# Patient Record
Sex: Female | Born: 1980
Health system: Southern US, Community
[De-identification: ages and names within clinical notes are randomized; demographics above are authoritative.]

## PROBLEM LIST (undated history)

## (undated) DIAGNOSIS — M5416 Radiculopathy, lumbar region: Secondary | ICD-10-CM

## (undated) DIAGNOSIS — R202 Paresthesia of skin: Secondary | ICD-10-CM

## (undated) DIAGNOSIS — E7800 Pure hypercholesterolemia, unspecified: Secondary | ICD-10-CM

## (undated) DIAGNOSIS — M1990 Unspecified osteoarthritis, unspecified site: Secondary | ICD-10-CM

## (undated) DIAGNOSIS — M549 Dorsalgia, unspecified: Secondary | ICD-10-CM

## (undated) DIAGNOSIS — I10 Essential (primary) hypertension: Secondary | ICD-10-CM

## (undated) DIAGNOSIS — G629 Polyneuropathy, unspecified: Secondary | ICD-10-CM

## (undated) DIAGNOSIS — G8929 Other chronic pain: Secondary | ICD-10-CM

## (undated) DIAGNOSIS — F410 Panic disorder [episodic paroxysmal anxiety] without agoraphobia: Secondary | ICD-10-CM

## (undated) DIAGNOSIS — F419 Anxiety disorder, unspecified: Secondary | ICD-10-CM

## (undated) HISTORY — DX: Pure hypercholesterolemia, unspecified: E78.00

## (undated) HISTORY — PX: NO PAST SURGERIES: SHX2092

---

## 1998-02-03 ENCOUNTER — Emergency Department (HOSPITAL_COMMUNITY): Admission: EM | Admit: 1998-02-03 | Discharge: 1998-02-03 | Payer: Self-pay | Admitting: Emergency Medicine

## 1998-03-26 ENCOUNTER — Emergency Department (HOSPITAL_COMMUNITY): Admission: EM | Admit: 1998-03-26 | Discharge: 1998-03-26 | Payer: Self-pay | Admitting: Emergency Medicine

## 1998-08-22 ENCOUNTER — Encounter: Payer: Self-pay | Admitting: Emergency Medicine

## 1998-08-22 ENCOUNTER — Emergency Department (HOSPITAL_COMMUNITY): Admission: EM | Admit: 1998-08-22 | Discharge: 1998-08-22 | Payer: Self-pay | Admitting: Emergency Medicine

## 1998-10-08 ENCOUNTER — Inpatient Hospital Stay (HOSPITAL_COMMUNITY): Admission: AD | Admit: 1998-10-08 | Discharge: 1998-10-11 | Payer: Self-pay | Admitting: *Deleted

## 1998-11-19 ENCOUNTER — Ambulatory Visit (HOSPITAL_COMMUNITY): Admission: RE | Admit: 1998-11-19 | Discharge: 1998-11-19 | Payer: Self-pay | Admitting: *Deleted

## 1998-11-28 ENCOUNTER — Inpatient Hospital Stay (HOSPITAL_COMMUNITY): Admission: AD | Admit: 1998-11-28 | Discharge: 1998-11-28 | Payer: Self-pay | Admitting: *Deleted

## 1999-01-17 ENCOUNTER — Inpatient Hospital Stay (HOSPITAL_COMMUNITY): Admission: AD | Admit: 1999-01-17 | Discharge: 1999-01-17 | Payer: Self-pay | Admitting: *Deleted

## 1999-01-29 ENCOUNTER — Ambulatory Visit (HOSPITAL_COMMUNITY): Admission: RE | Admit: 1999-01-29 | Discharge: 1999-01-29 | Payer: Self-pay | Admitting: Obstetrics & Gynecology

## 1999-02-04 ENCOUNTER — Inpatient Hospital Stay (HOSPITAL_COMMUNITY): Admission: AD | Admit: 1999-02-04 | Discharge: 1999-02-04 | Payer: Self-pay | Admitting: Obstetrics

## 1999-02-13 ENCOUNTER — Encounter (HOSPITAL_COMMUNITY): Admission: RE | Admit: 1999-02-13 | Discharge: 1999-03-16 | Payer: Self-pay | Admitting: Obstetrics & Gynecology

## 1999-02-22 ENCOUNTER — Inpatient Hospital Stay (HOSPITAL_COMMUNITY): Admission: AD | Admit: 1999-02-22 | Discharge: 1999-02-22 | Payer: Self-pay | Admitting: Obstetrics

## 1999-03-09 ENCOUNTER — Encounter: Payer: Self-pay | Admitting: Obstetrics & Gynecology

## 1999-03-13 ENCOUNTER — Inpatient Hospital Stay (HOSPITAL_COMMUNITY): Admission: AD | Admit: 1999-03-13 | Discharge: 1999-03-15 | Payer: Self-pay | Admitting: Obstetrics & Gynecology

## 1999-08-01 ENCOUNTER — Emergency Department (HOSPITAL_COMMUNITY): Admission: EM | Admit: 1999-08-01 | Discharge: 1999-08-01 | Payer: Self-pay | Admitting: Emergency Medicine

## 1999-11-09 ENCOUNTER — Emergency Department (HOSPITAL_COMMUNITY): Admission: EM | Admit: 1999-11-09 | Discharge: 1999-11-09 | Payer: Self-pay | Admitting: Emergency Medicine

## 2000-01-26 ENCOUNTER — Emergency Department (HOSPITAL_COMMUNITY): Admission: EM | Admit: 2000-01-26 | Discharge: 2000-01-26 | Payer: Self-pay | Admitting: *Deleted

## 2000-01-29 ENCOUNTER — Emergency Department (HOSPITAL_COMMUNITY): Admission: EM | Admit: 2000-01-29 | Discharge: 2000-01-29 | Payer: Self-pay | Admitting: Emergency Medicine

## 2000-04-09 ENCOUNTER — Emergency Department (HOSPITAL_COMMUNITY): Admission: EM | Admit: 2000-04-09 | Discharge: 2000-04-09 | Payer: Self-pay | Admitting: Emergency Medicine

## 2000-10-06 ENCOUNTER — Ambulatory Visit (HOSPITAL_COMMUNITY): Admission: AD | Admit: 2000-10-06 | Discharge: 2000-10-06 | Payer: Self-pay | Admitting: Obstetrics and Gynecology

## 2000-10-06 ENCOUNTER — Encounter (INDEPENDENT_AMBULATORY_CARE_PROVIDER_SITE_OTHER): Payer: Self-pay

## 2001-01-15 ENCOUNTER — Emergency Department (HOSPITAL_COMMUNITY): Admission: EM | Admit: 2001-01-15 | Discharge: 2001-01-15 | Payer: Self-pay | Admitting: Emergency Medicine

## 2001-04-30 ENCOUNTER — Emergency Department (HOSPITAL_COMMUNITY): Admission: EM | Admit: 2001-04-30 | Discharge: 2001-04-30 | Payer: Self-pay | Admitting: Emergency Medicine

## 2001-07-23 ENCOUNTER — Emergency Department (HOSPITAL_COMMUNITY): Admission: EM | Admit: 2001-07-23 | Discharge: 2001-07-23 | Payer: Self-pay | Admitting: Emergency Medicine

## 2001-10-01 ENCOUNTER — Emergency Department (HOSPITAL_COMMUNITY): Admission: EM | Admit: 2001-10-01 | Discharge: 2001-10-01 | Payer: Self-pay | Admitting: Emergency Medicine

## 2002-03-01 ENCOUNTER — Inpatient Hospital Stay (HOSPITAL_COMMUNITY): Admission: AD | Admit: 2002-03-01 | Discharge: 2002-03-01 | Payer: Self-pay | Admitting: *Deleted

## 2002-03-13 ENCOUNTER — Inpatient Hospital Stay (HOSPITAL_COMMUNITY): Admission: AD | Admit: 2002-03-13 | Discharge: 2002-03-13 | Payer: Self-pay | Admitting: *Deleted

## 2002-03-28 ENCOUNTER — Inpatient Hospital Stay (HOSPITAL_COMMUNITY): Admission: AD | Admit: 2002-03-28 | Discharge: 2002-03-28 | Payer: Self-pay | Admitting: *Deleted

## 2002-04-23 ENCOUNTER — Ambulatory Visit (HOSPITAL_COMMUNITY): Admission: RE | Admit: 2002-04-23 | Discharge: 2002-04-23 | Payer: Self-pay | Admitting: *Deleted

## 2002-05-23 ENCOUNTER — Ambulatory Visit (HOSPITAL_COMMUNITY): Admission: RE | Admit: 2002-05-23 | Discharge: 2002-05-23 | Payer: Self-pay | Admitting: *Deleted

## 2002-05-30 ENCOUNTER — Encounter: Payer: Self-pay | Admitting: Obstetrics and Gynecology

## 2002-05-30 ENCOUNTER — Observation Stay (HOSPITAL_COMMUNITY): Admission: AD | Admit: 2002-05-30 | Discharge: 2002-05-31 | Payer: Self-pay | Admitting: Obstetrics and Gynecology

## 2002-06-16 ENCOUNTER — Inpatient Hospital Stay (HOSPITAL_COMMUNITY): Admission: AD | Admit: 2002-06-16 | Discharge: 2002-06-16 | Payer: Self-pay | Admitting: Family Medicine

## 2002-06-18 ENCOUNTER — Inpatient Hospital Stay (HOSPITAL_COMMUNITY): Admission: AD | Admit: 2002-06-18 | Discharge: 2002-06-18 | Payer: Self-pay | Admitting: *Deleted

## 2002-06-22 ENCOUNTER — Inpatient Hospital Stay (HOSPITAL_COMMUNITY): Admission: AD | Admit: 2002-06-22 | Discharge: 2002-06-22 | Payer: Self-pay | Admitting: Obstetrics and Gynecology

## 2002-06-25 ENCOUNTER — Inpatient Hospital Stay (HOSPITAL_COMMUNITY): Admission: AD | Admit: 2002-06-25 | Discharge: 2002-06-25 | Payer: Self-pay | Admitting: Obstetrics and Gynecology

## 2002-07-06 ENCOUNTER — Inpatient Hospital Stay (HOSPITAL_COMMUNITY): Admission: AD | Admit: 2002-07-06 | Discharge: 2002-07-12 | Payer: Self-pay | Admitting: Family Medicine

## 2002-07-08 ENCOUNTER — Encounter (INDEPENDENT_AMBULATORY_CARE_PROVIDER_SITE_OTHER): Payer: Self-pay | Admitting: *Deleted

## 2002-10-11 ENCOUNTER — Encounter: Admission: RE | Admit: 2002-10-11 | Discharge: 2002-10-11 | Payer: Self-pay | Admitting: Obstetrics and Gynecology

## 2002-11-09 ENCOUNTER — Encounter: Admission: RE | Admit: 2002-11-09 | Discharge: 2002-11-09 | Payer: Self-pay | Admitting: Family Medicine

## 2003-07-01 ENCOUNTER — Emergency Department (HOSPITAL_COMMUNITY): Admission: EM | Admit: 2003-07-01 | Discharge: 2003-07-01 | Payer: Self-pay | Admitting: *Deleted

## 2003-10-31 ENCOUNTER — Emergency Department (HOSPITAL_COMMUNITY): Admission: EM | Admit: 2003-10-31 | Discharge: 2003-10-31 | Payer: Self-pay | Admitting: Emergency Medicine

## 2004-03-29 ENCOUNTER — Emergency Department (HOSPITAL_COMMUNITY): Admission: EM | Admit: 2004-03-29 | Discharge: 2004-03-29 | Payer: Self-pay

## 2004-05-11 ENCOUNTER — Emergency Department (HOSPITAL_COMMUNITY): Admission: EM | Admit: 2004-05-11 | Discharge: 2004-05-11 | Payer: Self-pay | Admitting: Emergency Medicine

## 2004-08-31 ENCOUNTER — Emergency Department (HOSPITAL_COMMUNITY): Admission: EM | Admit: 2004-08-31 | Discharge: 2004-08-31 | Payer: Self-pay | Admitting: Emergency Medicine

## 2004-10-26 ENCOUNTER — Emergency Department (HOSPITAL_COMMUNITY): Admission: EM | Admit: 2004-10-26 | Discharge: 2004-10-27 | Payer: Self-pay | Admitting: Emergency Medicine

## 2006-04-03 ENCOUNTER — Emergency Department (HOSPITAL_COMMUNITY): Admission: EM | Admit: 2006-04-03 | Discharge: 2006-04-04 | Payer: Self-pay | Admitting: Emergency Medicine

## 2006-07-06 ENCOUNTER — Emergency Department (HOSPITAL_COMMUNITY): Admission: EM | Admit: 2006-07-06 | Discharge: 2006-07-06 | Payer: Self-pay | Admitting: Emergency Medicine

## 2006-08-04 ENCOUNTER — Emergency Department (HOSPITAL_COMMUNITY): Admission: EM | Admit: 2006-08-04 | Discharge: 2006-08-05 | Payer: Self-pay | Admitting: Emergency Medicine

## 2006-08-18 ENCOUNTER — Ambulatory Visit (HOSPITAL_COMMUNITY): Admission: RE | Admit: 2006-08-18 | Discharge: 2006-08-18 | Payer: Self-pay | Admitting: Obstetrics and Gynecology

## 2008-01-02 ENCOUNTER — Emergency Department (HOSPITAL_COMMUNITY): Admission: EM | Admit: 2008-01-02 | Discharge: 2008-01-03 | Payer: Self-pay | Admitting: Emergency Medicine

## 2008-03-24 ENCOUNTER — Emergency Department (HOSPITAL_COMMUNITY): Admission: EM | Admit: 2008-03-24 | Discharge: 2008-03-25 | Payer: Self-pay | Admitting: Emergency Medicine

## 2008-08-12 ENCOUNTER — Emergency Department (HOSPITAL_COMMUNITY): Admission: EM | Admit: 2008-08-12 | Discharge: 2008-08-12 | Payer: Self-pay | Admitting: Emergency Medicine

## 2008-08-22 ENCOUNTER — Emergency Department (HOSPITAL_COMMUNITY): Admission: EM | Admit: 2008-08-22 | Discharge: 2008-08-22 | Payer: Self-pay | Admitting: Emergency Medicine

## 2009-07-22 ENCOUNTER — Emergency Department (HOSPITAL_COMMUNITY): Admission: EM | Admit: 2009-07-22 | Discharge: 2009-07-22 | Payer: Self-pay | Admitting: Emergency Medicine

## 2009-07-31 ENCOUNTER — Emergency Department (HOSPITAL_COMMUNITY): Admission: EM | Admit: 2009-07-31 | Discharge: 2009-07-31 | Payer: Self-pay | Admitting: Emergency Medicine

## 2009-08-01 ENCOUNTER — Emergency Department (HOSPITAL_COMMUNITY): Admission: EM | Admit: 2009-08-01 | Discharge: 2009-08-01 | Payer: Self-pay | Admitting: Emergency Medicine

## 2010-05-28 ENCOUNTER — Emergency Department (HOSPITAL_COMMUNITY): Admission: EM | Admit: 2010-05-28 | Discharge: 2009-12-11 | Payer: Self-pay | Admitting: Emergency Medicine

## 2010-07-18 ENCOUNTER — Emergency Department (HOSPITAL_COMMUNITY)
Admission: EM | Admit: 2010-07-18 | Discharge: 2010-07-18 | Payer: Self-pay | Source: Home / Self Care | Admitting: Emergency Medicine

## 2010-07-18 LAB — BASIC METABOLIC PANEL
CO2: 26 mEq/L (ref 19–32)
Chloride: 99 mEq/L (ref 96–112)
Creatinine, Ser: 0.79 mg/dL (ref 0.4–1.2)
GFR calc Af Amer: >60 mL/min (ref >60)
Potassium: 3 mEq/L — ABNORMAL LOW (ref 3.5–5.1)

## 2010-07-18 LAB — CBC
HCT: 40.1 % (ref 36.0–46.0)
Hemoglobin: 15 g/dL (ref 12.0–15.0)
MCH: 32.5 pg (ref 26.0–34.0)
MCV: 86.8 fL (ref 78.0–100.0)
RBC: 4.62 MIL/uL (ref 3.87–5.11)

## 2010-07-18 LAB — DIFFERENTIAL
Basophils Relative: 0 % (ref 0–1)
Eosinophils Absolute: 0.1 10*3/uL (ref 0.0–0.7)
Eosinophils Relative: 1 % (ref 0–5)
Lymphocytes Relative: 33 % (ref 12–46)
Neutro Abs: 5 10*3/uL (ref 1.7–7.7)

## 2010-09-06 LAB — POCT I-STAT, CHEM 8
BUN: 8 mg/dL (ref 6–23)
Creatinine, Ser: 0.6 mg/dL (ref 0.4–1.2)
Hemoglobin: 13.6 g/dL (ref 12.0–15.0)
Potassium: 3.2 mEq/L — ABNORMAL LOW (ref 3.5–5.1)
Sodium: 137 mEq/L (ref 135–145)
TCO2: 21 mmol/L (ref 0–100)

## 2010-09-06 LAB — DIFFERENTIAL
Basophils Absolute: 0.1 10*3/uL (ref 0.0–0.1)
Eosinophils Absolute: 0.1 10*3/uL (ref 0.0–0.7)
Eosinophils Relative: 1 % (ref 0–5)
Lymphocytes Relative: 18 % (ref 12–46)
Lymphs Abs: 2 10*3/uL (ref 0.7–4.0)
Neutrophils Relative %: 74 % (ref 43–77)

## 2010-09-06 LAB — CBC
Platelets: 363 10*3/uL (ref 150–400)
RBC: 4.12 MIL/uL (ref 3.87–5.11)
RDW: 13.4 % (ref 11.5–15.5)
WBC: 11.1 10*3/uL — ABNORMAL HIGH (ref 4.0–10.5)

## 2010-09-06 LAB — GC/CHLAMYDIA PROBE AMP, GENITAL: Chlamydia, DNA Probe: NEGATIVE

## 2010-09-06 LAB — WET PREP, GENITAL
Trich, Wet Prep: NONE SEEN
Yeast Wet Prep HPF POC: NONE SEEN

## 2010-09-06 LAB — URINALYSIS, ROUTINE W REFLEX MICROSCOPIC
Bilirubin Urine: NEGATIVE
Glucose, UA: NEGATIVE mg/dL
Ketones, ur: NEGATIVE mg/dL
Specific Gravity, Urine: 1.017 (ref 1.005–1.030)
pH: 6 (ref 5.0–8.0)

## 2010-09-06 LAB — URINE MICROSCOPIC-ADD ON

## 2010-09-09 LAB — WET PREP, GENITAL: Yeast Wet Prep HPF POC: NONE SEEN

## 2010-09-09 LAB — CBC
MCHC: 34.3 g/dL (ref 30.0–36.0)
Platelets: 322 10*3/uL (ref 150–400)
RDW: 13.1 % (ref 11.5–15.5)

## 2010-09-09 LAB — GC/CHLAMYDIA PROBE AMP, GENITAL: Chlamydia, DNA Probe: NEGATIVE

## 2010-09-09 LAB — URINALYSIS, ROUTINE W REFLEX MICROSCOPIC
Bilirubin Urine: NEGATIVE
Glucose, UA: NEGATIVE mg/dL
Specific Gravity, Urine: 1.026 (ref 1.005–1.030)

## 2010-09-09 LAB — COMPREHENSIVE METABOLIC PANEL
ALT: 21 U/L (ref 0–35)
Albumin: 3.8 g/dL (ref 3.5–5.2)
Alkaline Phosphatase: 68 U/L (ref 39–117)
Calcium: 8.8 mg/dL (ref 8.4–10.5)
GFR calc Af Amer: >60 mL/min (ref >60)
Glucose, Bld: 98 mg/dL (ref 70–99)
Potassium: 3.6 mEq/L (ref 3.5–5.1)
Sodium: 135 mEq/L (ref 135–145)
Total Protein: 7.1 g/dL (ref 6.0–8.3)

## 2010-09-09 LAB — URINE MICROSCOPIC-ADD ON

## 2010-09-09 LAB — DIFFERENTIAL
Eosinophils Absolute: 0 10*3/uL (ref 0.0–0.7)
Lymphs Abs: 3.1 10*3/uL (ref 0.7–4.0)
Monocytes Absolute: 0.6 10*3/uL (ref 0.1–1.0)
Monocytes Relative: 7 % (ref 3–12)
Neutrophils Relative %: 58 % (ref 43–77)

## 2010-09-09 LAB — POCT PREGNANCY, URINE: Preg Test, Ur: NEGATIVE

## 2010-10-01 LAB — POCT I-STAT, CHEM 8
BUN: 3 mg/dL — ABNORMAL LOW (ref 6–23)
Chloride: 110 mEq/L (ref 96–112)
Sodium: 133 mEq/L — ABNORMAL LOW (ref 135–145)

## 2010-10-01 LAB — DIFFERENTIAL
Basophils Absolute: 0 10*3/uL (ref 0.0–0.1)
Lymphocytes Relative: 22 % (ref 12–46)
Lymphs Abs: 1.7 10*3/uL (ref 0.7–4.0)
Neutro Abs: 5.4 10*3/uL (ref 1.7–7.7)

## 2010-10-01 LAB — POCT CARDIAC MARKERS
CKMB, poc: <1 ng/mL — ABNORMAL LOW (ref 1.0–8.0)
Myoglobin, poc: 45.5 ng/mL (ref 12–200)
Troponin i, poc: <0.05 ng/mL (ref 0.00–0.09)

## 2010-10-01 LAB — CBC
Hemoglobin: 13.3 g/dL (ref 12.0–15.0)
Platelets: 318 10*3/uL (ref 150–400)
RDW: 13.2 % (ref 11.5–15.5)
WBC: 7.9 10*3/uL (ref 4.0–10.5)

## 2010-11-06 NOTE — Discharge Summary (Signed)
NAME:  Jacqueline Juarez, Jacqueline Juarez                      ACCOUNT NO.:  [id number]   MEDICAL RECORD NO.:  [id number]                   PATIENT TYPE:  INP   LOCATION:  9111                                 FACILITY:  [facility]   PHYSICIAN:  Tanya S. Shawnie Pons, M.D.                DATE OF BIRTH:  1981-01-01   DATE OF ADMISSION:  07/06/2002  DATE OF DISCHARGE:  07/12/2002                                 DISCHARGE SUMMARY   DISCHARGE DIAGNOSES:  1. Status post low transverse cesarean section.  2. Mild preeclampsia.  3. Anemia.  4. Right breast mass.   DISCHARGE MEDICATIONS:  1. Percocet 5/325 mg one q.4-6h. p.r.n. pain.  2. Ferrous sulfate 325 mg daily.  3. Depo Provera 150 mg IM x1 prior to discharge.   FOLLOW UP:  The patient instructed to follow up at the GYN Clinic in four  weeks for her postpartum check and to schedule a bilateral tubal ligation.  She is also encouraged to follow up on the breast mass for possible  ultrasound.   HOSPITAL COURSE:  Problem 1 - PREGNANCY:  This 30 year old African-American  female who was a gravida 3, para 1-0-1-1 presented to the [facility] at 37 weeks  and 6 days gestational age after being sent over from clinic secondary to  high blood pressure.  Her prenatal course was complicated by history of a  seizure, last one in 2000, anxiety attacks, radiation exposure, and anemia.  On admission she is afebrile.  Her blood pressure is 140/100.  Her cervix  was dilated 1 cm.  She was -3 and posterior.  Her strip was reassuring and  she was intermittently contracting.  She was admitted and treated with  clindamycin for GBS positive and induction of labor.  The patient was given  Cervidil x1 and started on Pitocin low dose protocol.  The patient was also  given Cytotec x2 and started on magnesium sulfate after she was in active  labor.  On hospital day number two patient was taken to the OR for cesarean  section as her cervix was 4-5 cm but she had had persistent nonreassuring  fetal heart rate and suspected abruption.  The patient tolerated surgery  well and an abruption was seen during surgery.  Please see operative note  for further details.  The patient delivered a viable female with cord pH  7.28.  Apgars 7 at one minute and 9 at five minutes.  The patient was  continued on magnesium sulfate postpartum and in order to reach a  therapeutic level, magnesium was increased to 3.5 g/hour.  At that point  patient diuresed 3 L and her IV infiltrated and at that point the magnesium  sulfate was discontinued.  Her blood pressures were stable on day of  discharge ranging from the 120s highest 140/70s-90s and she had no  complaints of headache or shortness of breath.  Her laboratories were  trending down and  she continued to diurese.  On day of discharge patient was  bottle feeding without difficulty and was also started on iron therapy for  anemia.  For contraception she will receive a Depo shot prior to discharge  and will sign tubal ligation papers as well and be seen in GYN Clinic in  four weeks to schedule that procedure.  Problem 2 - During hospitalization patient complained of lump in right  breast.  She was examined by a second year resident as well as Phil D. Rose,  M.D. who felt this right upper outer quadrant mass should be observed for  four to six weeks and follows up as an outpatient.  The patient understands  and has no further questions.   ADMISSION LABORATORY DATA:  Urinalysis with 100 protein.  Uric acid 4.4.  Creatinine 0.7.  AST 14, ALT less than 19, LDH 183.  White count 9.1,  hemoglobin 11.4, hematocrit 33.1, platelets 258,000.    DISCHARGE LABORATORY DATA:  White count 13.8, hemoglobin 9.2, hematocrit  27.3, platelets 260,000.  Creatinine 0.7.  AST 16, ALT less than 19, LDH  238, uric acid 5.8 decreased from 6.1.     Billey Gosling, M.D.                       Shelbie Proctor. Shawnie Pons, M.D.    AS/MEDQ  D:  07/12/2002  T:  07/12/2002  Job:  [id number]

## 2010-11-06 NOTE — Op Note (Signed)
[facility]  Patient:    Jacqueline Juarez, Jacqueline Juarez                     MRN: [id number] Proc. Date: 10/06/00 Adm. Date:  [id number] Attending:  Osborn Coho                           Operative Report  PREOPERATIVE DIAGNOSIS:       Spontaneous abortion.  POSTOPERATIVE DIAGNOSIS:      Spontaneous abortion.  PROCEDURE:                    Dilation and curettage.  SURGEON:                      Mark E. Dareen Piano, M.D.  ANESTHESIA:                   MAC with paracervical block.  ESTIMATED BLOOD LOSS:         50 cc.  COMPLICATIONS:                None.  DRAINS:                       Red rubber catheter to the bladder.  SPECIMENS:                    Products of conception sent to pathology.  INDICATIONS:                  The patient is a 30 year old black female who presented to [facility] complaining of vaginal bleeding.  She was evaluated by Dr. Beverely Pace, who found products of conception in the office.  she was then transferred to the [facility], where she was set up for dilation and curettage.  DESCRIPTION OF PROCEDURE:     The patient was taken to the operating room, where she was placed in the dorsal lithotomy position.  She was prepped with Betadine and draped in the usual fashion for this procedure.  MAC anesthesia was administered.  A paracervical block was placed.  A single-tooth tenaculum was applied to the anterior cervical lip.  The cervix was dilated to a #29 [place].  A 9 mm suction cannula was placed into the uterine cavity and products of conception withdrawn.  Sharp curettage was then performed, followed by repeat suction.  The patient tolerated the procedure well.  She was discharged to home.  She was sent home with Keflex 500 mg q.i.d. for two days and Anaprox Double Strength p.r.n.  The patients blood type is B positive and, therefore, no RhoGAM was indicated. DD:  10/06/00 TD:  10/08/00 Job: [id number]

## 2010-11-06 NOTE — Op Note (Signed)
NAME:  Jacqueline, Juarez                      ACCOUNT NO.:  [id number]   MEDICAL RECORD NO.:  [id number]                   PATIENT TYPE:  INP   LOCATION:  9178                                 FACILITY:  [facility]   PHYSICIAN:  Tanya S. Shawnie Pons, M.D.                DATE OF BIRTH:  Apr 09, 1981   DATE OF PROCEDURE:  07/08/2002  DATE OF DISCHARGE:                                 OPERATIVE REPORT   PREOPERATIVE DIAGNOSES:  1. Intrauterine pregnancy at 38-1/7 weeks.  2. Mild preeclampsia.  3. Nonreassuring fetal status.   POSTOPERATIVE DIAGNOSES:  1. Intrauterine pregnancy at 38-1/7 weeks.  2. Mild preeclampsia.  3. Nonreassuring fetal status.  4. Placental abruption.   PROCEDURE:  Primary low transverse cesarean section.   SURGEON:  Shelbie Proctor. Shawnie Pons, M.D.   ASSISTANT:  __________   ANESTHESIA:  Epidural, Burnett Corrente, M.D.   FINDINGS:  Viable female infant, Apgars 7 and 9, cord pH 7.28.  Weight is 6  pounds 15 ounces.  She has normal tubes, normal ovaries.  There was a  significant amount of clot in the uterus.   ESTIMATED BLOOD LOSS:  Approximately 1000 cubic centimeters.   SPECIMENS:  Placenta to pathology.   COMPLICATIONS:  None.   REASON FOR PROCEDURE:  Briefly, the patient is a 30 year old gravida 3, para  1, who is being induced at 38-1/7 weeks for mild preeclampsia at term.  Apparently her water spontaneously broke apart at approximately 1 p.m. and  she was apparently 3-4 cm.  She began to develop repetitive lates and what  had previously been a reassuring tracing, when I was called to see the  patient it was approximately 2:05, and there were repetitive lates noted  with decreased variability.  Additionally, the patient had a copious amount  of frank port wine bleeding from the vagina, suspected placental abruption  and counseled patient regarding risks and benefits of proceeding with  operative delivery.  The patient agreed, and we proceeded to the operating   room.   DESCRIPTION OF PROCEDURE:  The patient was placed in the supine position  with a left lateral tilt, and her epidural anesthesia was bolused.  She was  then prepped and draped in the usual sterile fashion and an Allis stent was  used to test the skin prior to making incision.  The patient was still  feeling a bit of pain and then an extra epidural bolus plus fentanyl IV and  Marcaine was used to numb the skin at which time Allis test was negative.  A  Pfannenstiel incision was then made through the skin, and the suture was  carried down to the underlying fascia which was sharply divided in the  midline.  This incision was extended with laterally with Mayo scissors.  The  Kocher clamps were then used to grasp the superior edge of the fascia and  dissect from the rectus muscle bluntly laterally and  sharply in the midline.  Similarly the inferior edge of the fascia was dissected from the rectus  muscle.  The rectus muscle was then divided in the midline bluntly and the  peritoneal cavity entered bluntly.  The incision was extended with two  __________ on either side of the incision.  Bladder blade was placed inside  the abdomen and a knife was used to make a low transverse incision on the  uterus.  Upon entry into the uterus, clots began copiously coming out of the  incision.  The infant was found to be in a vertex presentation and was  delivered atraumatically through the abdominal incision.  There was an  umbilical cord wrapped around the infant but no nuchal cord noted.  The cord  was clamped x2, and the infant was bulb suctioned on the abdomen, and the  baby was handed to the waiting pediatricians.  As stated previously, Apgars  were 7 and 9 and weight was 6 pounds and 15 ounces.  Cord pH was obtained  which was 7.28.  Cord blood was also obtained.  The placenta was then  delivered through the incision, and the uterine cavity cleaned with a dry  lap pad.  Edges of the uterine  incision were then grasped with ring forceps  and the uterine incision closed with a locked running #1 Vicryl suture.  The  second imbricating layer was then placed over that with good hemostasis  being found.  The abdomen was then copiously irrigated and the adnexa  visualized which appeared to be normal.  All blood clot was removed, and the  abdominal incision was again rechecked and found to be hemostatic.  A #1  Vicryl suture was then used to close the fascia in a running fashion.  Clips  were used to close the skin after irrigation, and hemostasis was obtained  with the Bovie cautery.  The patient tolerated the procedure well and was  awakened and taken to the recovery room in stable condition.  The infant was  taken to the newborn nursery also in stable condition.                                               Shelbie Proctor. Shawnie Pons, M.D.    TSP/MEDQ  D:  07/08/2002  T:  07/08/2002  Job:  [id number]

## 2011-03-18 LAB — DIFFERENTIAL
Basophils Absolute: 0
Eosinophils Absolute: 0.1
Eosinophils Relative: 1
Lymphs Abs: 2.6

## 2011-03-18 LAB — CBC
RBC: 4.2
WBC: 9.9

## 2011-03-18 LAB — COMPREHENSIVE METABOLIC PANEL
ALT: 21
AST: 19
CO2: 25
Chloride: 102
GFR calc Af Amer: >60
GFR calc non Af Amer: >60
Sodium: 135
Total Bilirubin: 1

## 2011-03-18 LAB — POCT PREGNANCY, URINE: Preg Test, Ur: NEGATIVE

## 2011-03-18 LAB — LIPASE, BLOOD: Lipase: 25

## 2011-03-19 LAB — URINE MICROSCOPIC-ADD ON

## 2011-03-19 LAB — URINALYSIS, ROUTINE W REFLEX MICROSCOPIC
Bilirubin Urine: NEGATIVE
Glucose, UA: NEGATIVE
Specific Gravity, Urine: 1.019
pH: 6

## 2011-03-23 LAB — POCT I-STAT, CHEM 8
BUN: <3 — ABNORMAL LOW
Chloride: 105
Creatinine, Ser: 0.7
Sodium: 137
TCO2: 23

## 2011-03-23 LAB — POCT CARDIAC MARKERS
CKMB, poc: <1 — ABNORMAL LOW
CKMB, poc: <1 — ABNORMAL LOW
Myoglobin, poc: 42.8
Troponin i, poc: <0.05

## 2011-03-23 LAB — URINALYSIS, ROUTINE W REFLEX MICROSCOPIC
Ketones, ur: NEGATIVE
Protein, ur: NEGATIVE
Urobilinogen, UA: 0.2

## 2011-03-23 LAB — URINE MICROSCOPIC-ADD ON

## 2012-03-20 ENCOUNTER — Emergency Department (HOSPITAL_COMMUNITY)
Admission: EM | Admit: 2012-03-20 | Discharge: 2012-03-20 | Disposition: A | Discharge status: Home / Self Care | Payer: Medicaid Other | Attending: Emergency Medicine | Admitting: Emergency Medicine

## 2012-03-20 ENCOUNTER — Encounter (HOSPITAL_COMMUNITY): Payer: Self-pay | Admitting: *Deleted

## 2012-03-20 DIAGNOSIS — I10 Essential (primary) hypertension: Secondary | ICD-10-CM | POA: Insufficient documentation

## 2012-03-20 DIAGNOSIS — M129 Arthropathy, unspecified: Secondary | ICD-10-CM | POA: Insufficient documentation

## 2012-03-20 DIAGNOSIS — K602 Anal fissure, unspecified: Secondary | ICD-10-CM | POA: Insufficient documentation

## 2012-03-20 DIAGNOSIS — E876 Hypokalemia: Secondary | ICD-10-CM | POA: Insufficient documentation

## 2012-03-20 HISTORY — DX: Essential (primary) hypertension: I10

## 2012-03-20 HISTORY — DX: Unspecified osteoarthritis, unspecified site: M19.90

## 2012-03-20 HISTORY — DX: Anxiety disorder, unspecified: F41.9

## 2012-03-20 HISTORY — DX: Panic disorder (episodic paroxysmal anxiety): F41.0

## 2012-03-20 LAB — CBC WITH DIFFERENTIAL/PLATELET
Basophils Absolute: 0 10*3/uL (ref 0.0–0.1)
Basophils Relative: 0 % (ref 0–1)
Eosinophils Absolute: 0.1 10*3/uL (ref 0.0–0.7)
Eosinophils Relative: 1 % (ref 0–5)
MCH: 31.1 pg (ref 26.0–34.0)
MCHC: 35.6 g/dL (ref 30.0–36.0)
MCV: 87.4 fL (ref 78.0–100.0)
Neutrophils Relative %: 44 % (ref 43–77)
Platelets: 312 10*3/uL (ref 150–400)
RBC: 4.28 MIL/uL (ref 3.87–5.11)
RDW: 13.7 % (ref 11.5–15.5)

## 2012-03-20 LAB — POCT I-STAT, CHEM 8
BUN: 12 mg/dL (ref 6–23)
Calcium, Ion: 1.13 mmol/L (ref 1.12–1.23)
Hemoglobin: 13.3 g/dL (ref 12.0–15.0)
Sodium: 140 mEq/L (ref 135–145)
TCO2: 26 mmol/L (ref 0–100)

## 2012-03-20 LAB — URINALYSIS, ROUTINE W REFLEX MICROSCOPIC
Bilirubin Urine: NEGATIVE
Ketones, ur: NEGATIVE mg/dL
Nitrite: NEGATIVE
Protein, ur: NEGATIVE mg/dL
Specific Gravity, Urine: 1.022 (ref 1.005–1.030)
Urobilinogen, UA: 1 mg/dL (ref 0.0–1.0)

## 2012-03-20 LAB — URINE MICROSCOPIC-ADD ON

## 2012-03-20 LAB — POCT PREGNANCY, URINE: Preg Test, Ur: NEGATIVE

## 2012-03-20 MED ORDER — PSYLLIUM 28 % PO PACK: 1.0000 | PACK | Freq: Two times a day (BID) | ORAL | Status: DC

## 2012-03-20 MED ORDER — BISACODYL 5 MG PO TBEC: 5.0000 mg | DELAYED_RELEASE_TABLET | Freq: Every day | ORAL | Status: DC | PRN

## 2012-03-20 MED ORDER — NITROGLYCERIN 0.4 % RE OINT: 1.0000 "application " | TOPICAL_OINTMENT | Freq: Three times a day (TID) | RECTAL | Status: DC

## 2012-03-20 MED ORDER — POTASSIUM CHLORIDE CRYS ER 20 MEQ PO TBCR
40.0000 meq | EXTENDED_RELEASE_TABLET | Freq: Once | ORAL | Status: AC
  Administered 2012-03-20: 40 meq via ORAL
  Filled 2012-03-20: qty 2

## 2012-03-20 MED ORDER — POTASSIUM CHLORIDE CRYS ER 20 MEQ PO TBCR: 20.0000 meq | EXTENDED_RELEASE_TABLET | Freq: Every day | ORAL | Status: DC

## 2012-03-20 NOTE — ED Notes (Signed)
C/o bilateral lowe abd pain & rectal pain, onset 1 week ago, worse yesterday and today with BM, last BM ~ 1800 (normal), (denies: nvd, fever, bleeding, urinary or vaginal sx), last ate 1700. Unsure of hemorroids. Denies passing gas or belching. No meds PTA.

## 2012-03-20 NOTE — ED Provider Notes (Signed)
History     CSN: [id number]  Arrival date & time 03/20/12  2049   First MD Initiated Contact with Patient 03/20/12 2145      Chief Complaint  Patient presents with  . Abdominal Pain  . Rectal Pain    (Consider location/radiation/quality/duration/timing/severity/associated sxs/prior treatment) Patient is a 31 y.o. female presenting with hematochezia.  Rectal Bleeding  The current episode started today. The problem occurs rarely. The problem has been resolved. The pain is mild. The stool is described as hard. There was no prior unsuccessful therapy. Associated symptoms include rectal pain. Pertinent negatives include no anorexia, no fever, no abdominal pain, no diarrhea, no hematemesis, no nausea, no vomiting, no hematuria, no vaginal bleeding, no vaginal discharge, no chest pain, no headaches, no coughing, no difficulty breathing and no rash. She has been behaving normally. She has been eating and drinking normally. Her past medical history is significant for abdominal surgery (c section). Her past medical history does not include developmental delay, Hirschsprung's disease, inflammatory bowel disease, recent abdominal injury, recent antibiotic use, recent change in diet or a recent illness.    Past Medical History  Diagnosis Date  . Anxiety   . Panic   . Arthritis   . Hypertension     History reviewed. No pertinent past surgical history.  No family history on file.  History  Substance Use Topics  . Smoking status: Never Smoker   . Smokeless tobacco: Not on file  . Alcohol Use: No    OB History    Grav Para Term Preterm Abortions TAB SAB Ect Mult Living                  Review of Systems  Constitutional: Negative for fever, chills, activity change and appetite change.  HENT: Negative for ear pain, congestion, rhinorrhea and neck pain.   Eyes: Negative for pain.  Respiratory: Negative for cough and shortness of breath.   Cardiovascular: Negative for chest pain and  palpitations.  Gastrointestinal: Positive for hematochezia and rectal pain. Negative for nausea, vomiting, abdominal pain, diarrhea, anorexia and hematemesis.  Genitourinary: Negative for dysuria, hematuria, vaginal bleeding, vaginal discharge, difficulty urinating and pelvic pain.  Musculoskeletal: Negative for back pain.  Skin: Negative for rash and wound.  Neurological: Negative for weakness and headaches.  Psychiatric/Behavioral: Negative for behavioral problems, confusion and agitation.    Allergies  Penicillins  Home Medications   Current Outpatient Rx  Name Route Sig Dispense Refill  . HYDROCHLOROTHIAZIDE 25 MG PO TABS Oral Take 25 mg by mouth daily.    Marland Kitchen HYDROCODONE-ACETAMINOPHEN 5-325 MG PO TABS Oral Take 1 tablet by mouth every 6 (six) hours as needed. For pain    . OMEGA-3-ACID ETHYL ESTERS 1 G PO CAPS Oral Take 1 g by mouth daily.      BP 157/95  Pulse 92  Temp 99.2 F (37.3 C) (Oral)  Resp 18  SpO2 100%  LMP 02/18/2012  Physical Exam  Constitutional: She is oriented to person, place, and time. She appears well-developed and well-nourished. No distress.  HENT:  Head: Normocephalic and atraumatic.  Nose: Nose normal.  Mouth/Throat: Oropharynx is clear and moist.  Eyes: EOM are normal. Pupils are equal, round, and reactive to light.  Neck: Normal range of motion. Neck supple. No tracheal deviation present.  Cardiovascular: Normal rate, regular rhythm, normal heart sounds and intact distal pulses.   Pulmonary/Chest: Effort normal and breath sounds normal. She has no rales.  Abdominal: Soft. Bowel sounds are normal. She  exhibits no distension. There is no tenderness. There is no rigidity, no rebound, no guarding, no CVA tenderness, no tenderness at McBurney's point and negative Murphy's sign. No hernia.  Genitourinary: Guaiac negative stool.       No hemorrhoids. Reproducible pain on rectal digital exam.  No external visible fissure    Musculoskeletal: Normal  range of motion. She exhibits no tenderness.  Neurological: She is alert and oriented to person, place, and time.  Skin: Skin is warm and dry. No rash noted.  Psychiatric: She has a normal mood and affect. Her behavior is normal.    ED Course  Procedures (including critical care time)  Results for orders placed during the hospital encounter of 03/20/12  URINALYSIS, ROUTINE W REFLEX MICROSCOPIC      Component Value Range   Color, Urine YELLOW  YELLOW   APPearance CLOUDY (*) CLEAR   Specific Gravity, Urine 1.022  1.005 - 1.030   pH 7.0  5.0 - 8.0   Glucose, UA NEGATIVE  NEGATIVE mg/dL   Hgb urine dipstick SMALL (*) NEGATIVE   Bilirubin Urine NEGATIVE  NEGATIVE   Ketones, ur NEGATIVE  NEGATIVE mg/dL   Protein, ur NEGATIVE  NEGATIVE mg/dL   Urobilinogen, UA 1.0  0.0 - 1.0 mg/dL   Nitrite NEGATIVE  NEGATIVE   Leukocytes, UA MODERATE (*) NEGATIVE  CBC WITH DIFFERENTIAL      Component Value Range   WBC 9.3  4.0 - 10.5 K/uL   RBC 4.28  3.87 - 5.11 MIL/uL   Hemoglobin 13.3  12.0 - 15.0 g/dL   HCT 29.5  28.4 - 13.2 %   MCV 87.4  78.0 - 100.0 fL   MCH 31.1  26.0 - 34.0 pg   MCHC 35.6  30.0 - 36.0 g/dL   RDW 44.0  10.2 - 72.5 %   Platelets 312  150 - 400 K/uL   Neutrophils Relative 44  43 - 77 %   Neutro Abs 4.1  1.7 - 7.7 K/uL   Lymphocytes Relative 44  12 - 46 %   Lymphs Abs 4.1 (*) 0.7 - 4.0 K/uL   Monocytes Relative 11  3 - 12 %   Monocytes Absolute 1.0  0.1 - 1.0 K/uL   Eosinophils Relative 1  0 - 5 %   Eosinophils Absolute 0.1  0.0 - 0.7 K/uL   Basophils Relative 0  0 - 1 %   Basophils Absolute 0.0  0.0 - 0.1 K/uL  POCT PREGNANCY, URINE      Component Value Range   Preg Test, Ur NEGATIVE  NEGATIVE  POCT I-STAT, CHEM 8      Component Value Range   Sodium 140  135 - 145 mEq/L   Potassium 2.5 (*) 3.5 - 5.1 mEq/L   Chloride 101  96 - 112 mEq/L   BUN 12  6 - 23 mg/dL   Creatinine, Ser 3.66  0.50 - 1.10 mg/dL   Glucose, Bld 440 (*) 70 - 99 mg/dL   Calcium, Ion 3.47  4.25 -  1.23 mmol/L   TCO2 26  0 - 100 mmol/L   Hemoglobin 13.3  12.0 - 15.0 g/dL   HCT 95.6  38.7 - 56.4 %   Comment NOTIFIED PHYSICIAN    URINE MICROSCOPIC-ADD ON      Component Value Range   Squamous Epithelial / LPF MANY (*) RARE   WBC, UA 11-20  <3 WBC/hpf   RBC / HPF 3-6  <3 RBC/hpf   Bacteria, UA FEW (*)  RARE   Urine-Other MUCOUS PRESENT    OCCULT BLOOD, POC DEVICE      Component Value Range   Fecal Occult Bld NEGATIVE       1. Fissure in ano   2. Hypokalemia       MDM    31 yo F in no acute distress, afebrile, vital signs stable, non toxic appearing who presents with  New onset anal pain during defecation. Minimal streaks of blood seen on tissue after wiping.  No hemorrhoid on exam.  Has had multiple hard stools during past week. Suspect anal fissure given hx and exam. Thorough discussion with patient including return precautions and plan. Will follow up with PCP.      New Prescriptions   BISACODYL (BISACODYL) 5 MG EC TABLET    Take 1 tablet (5 mg total) by mouth daily as needed for constipation.   NITROGLYCERIN 0.4 % OINT    Place 1 application rectally 3 (three) times daily.   POTASSIUM CHLORIDE SA (K-DUR,KLOR-CON) 20 MEQ TABLET    Take 1 tablet (20 mEq total) by mouth daily.   PSYLLIUM (METAMUCIL SMOOTH TEXTURE) 28 % PACKET    Take 1 packet by mouth 2 (two) times daily.           Nadara Mustard, MD 03/21/12 [phone]

## 2012-03-21 NOTE — ED Provider Notes (Signed)
I saw and evaluated the patient, reviewed the resident's note and I agree with the findings and plan.   .Face to face Exam:  General:  Awake HEENT:  Atraumatic Resp:  Normal effort Abd:  Nondistended Neuro:No focal weakness Lymph: No adenopathy   Nelia Shi, MD 03/21/12 2354

## 2012-10-21 ENCOUNTER — Encounter (HOSPITAL_COMMUNITY): Payer: Self-pay | Admitting: Emergency Medicine

## 2012-10-21 ENCOUNTER — Emergency Department (HOSPITAL_COMMUNITY)
Admission: EM | Admit: 2012-10-21 | Discharge: 2012-10-22 | Disposition: A | Discharge status: Home / Self Care | Payer: Medicaid Other | Attending: Emergency Medicine | Admitting: Emergency Medicine

## 2012-10-21 DIAGNOSIS — A5901 Trichomonal vulvovaginitis: Secondary | ICD-10-CM

## 2012-10-21 DIAGNOSIS — E669 Obesity, unspecified: Secondary | ICD-10-CM | POA: Insufficient documentation

## 2012-10-21 DIAGNOSIS — I10 Essential (primary) hypertension: Secondary | ICD-10-CM | POA: Insufficient documentation

## 2012-10-21 DIAGNOSIS — M129 Arthropathy, unspecified: Secondary | ICD-10-CM | POA: Insufficient documentation

## 2012-10-21 DIAGNOSIS — A5900 Urogenital trichomoniasis, unspecified: Secondary | ICD-10-CM | POA: Insufficient documentation

## 2012-10-21 DIAGNOSIS — Z79899 Other long term (current) drug therapy: Secondary | ICD-10-CM | POA: Insufficient documentation

## 2012-10-21 DIAGNOSIS — R350 Frequency of micturition: Secondary | ICD-10-CM | POA: Insufficient documentation

## 2012-10-21 DIAGNOSIS — F410 Panic disorder [episodic paroxysmal anxiety] without agoraphobia: Secondary | ICD-10-CM | POA: Insufficient documentation

## 2012-10-21 DIAGNOSIS — F411 Generalized anxiety disorder: Secondary | ICD-10-CM | POA: Insufficient documentation

## 2012-10-21 LAB — CBC WITH DIFFERENTIAL/PLATELET
Basophils Absolute: 0 10*3/uL (ref 0.0–0.1)
Lymphocytes Relative: 31 % (ref 12–46)
Lymphs Abs: 2.8 10*3/uL (ref 0.7–4.0)
Neutro Abs: 5.4 10*3/uL (ref 1.7–7.7)
Platelets: 317 10*3/uL (ref 150–400)
RBC: 4.27 MIL/uL (ref 3.87–5.11)
RDW: 14 % (ref 11.5–15.5)
WBC: 9.2 10*3/uL (ref 4.0–10.5)

## 2012-10-21 LAB — BASIC METABOLIC PANEL
CO2: 25 mEq/L (ref 19–32)
Chloride: 104 mEq/L (ref 96–112)
Glucose, Bld: 99 mg/dL (ref 70–99)
Potassium: 3.2 mEq/L — ABNORMAL LOW (ref 3.5–5.1)
Sodium: 138 mEq/L (ref 135–145)

## 2012-10-21 LAB — URINE MICROSCOPIC-ADD ON

## 2012-10-21 LAB — URINALYSIS, ROUTINE W REFLEX MICROSCOPIC
Glucose, UA: NEGATIVE mg/dL
Protein, ur: NEGATIVE mg/dL
Specific Gravity, Urine: 1.012 (ref 1.005–1.030)
Urobilinogen, UA: 0.2 mg/dL (ref 0.0–1.0)

## 2012-10-21 NOTE — ED Notes (Signed)
Patient states that she is having pelvic and back pain

## 2012-10-22 LAB — WET PREP, GENITAL

## 2012-10-22 MED ORDER — ONDANSETRON 8 MG PO TBDP
8.0000 mg | ORAL_TABLET | Freq: Once | ORAL | Status: AC
  Administered 2012-10-22: 8 mg via ORAL
  Filled 2012-10-22: qty 1

## 2012-10-22 MED ORDER — METRONIDAZOLE 500 MG PO TABS
2000.0000 mg | ORAL_TABLET | Freq: Once | ORAL | Status: AC
  Administered 2012-10-22: 2000 mg via ORAL
  Filled 2012-10-22: qty 4

## 2012-10-22 NOTE — ED Provider Notes (Signed)
History     CSN: [id number]  Arrival date & time 10/21/12  2205   First MD Initiated Contact with Patient 10/22/12 0011      Chief Complaint  Patient presents with  . Pelvic Pain  . Back Pain    (Consider location/radiation/quality/duration/timing/severity/associated sxs/prior treatment) HPI History provided by pt.   Pt a poor historian.  Is complaining of lower abdominal pain that started while trying to urinate and have a bm this afternoon. Improved w/ mobic but worsened after trying to go to the bathroom again this evening.  Initially reports that it starts in her right buttock and radiates to her lower abdomen, but then reports that it originates into the groin, pointing at her suprapubic region.  Has had increased urinary frequency but no associated fever, N/V/D, hematochezia/melena, rectal pain or vaginal sx.  No trauma.  Pain is minimal currently.   Past Medical History  Diagnosis Date  . Anxiety   . Panic   . Arthritis   . Hypertension     History reviewed. No pertinent past surgical history.  History reviewed. No pertinent family history.  History  Substance Use Topics  . Smoking status: Never Smoker   . Smokeless tobacco: Not on file  . Alcohol Use: No    OB History   Grav Para Term Preterm Abortions TAB SAB Ect Mult Living                  Review of Systems  All other systems reviewed and are negative.    Allergies  Penicillins  Home Medications   Current Outpatient Rx  Name  Route  Sig  Dispense  Refill  . hydrochlorothiazide (HYDRODIURIL) 25 MG tablet   Oral   Take 25 mg by mouth every morning.         . meloxicam (MOBIC) 15 MG tablet   Oral   Take 15 mg by mouth every evening.         . Multiple Vitamin (MULTIVITAMIN WITH MINERALS) TABS   Oral   Take 1 tablet by mouth every morning.         . potassium chloride SA (K-DUR,KLOR-CON) 20 MEQ tablet   Oral   Take 20 mEq by mouth every morning.           BP 157/83  Pulse 77   Temp(Src) 98.6 F (37 C) (Oral)  Resp 18  Ht 5\' 4"  (1.626 m)  Wt 250 lb (113.399 kg)  BMI 42.89 kg/m2  SpO2 100%  LMP 10/03/2012  Physical Exam  Nursing note and vitals reviewed. Constitutional: She is oriented to person, place, and time. She appears well-developed and well-nourished. No distress.  HENT:  Head: Normocephalic and atraumatic.  Eyes:  Normal appearance  Neck: Normal range of motion.  Cardiovascular: Normal rate and regular rhythm.   Pulmonary/Chest: Effort normal and breath sounds normal. No respiratory distress.  Abdominal: Soft. Bowel sounds are normal. She exhibits no distension and no mass. There is no rebound and no guarding.  Obese.  Mild tenderness suprapubic and LLQ.    Genitourinary:  No CVA tenderness.  Nml external genitalia.  No vaginal discharge/bleeding.  Cervix closed and appears nml.  No adnexal or cervical motion tenderess.    Musculoskeletal: Normal range of motion.  Neurological: She is alert and oriented to person, place, and time.  Skin: Skin is warm and dry. No rash noted.  Psychiatric: She has a normal mood and affect. Her behavior is normal.  ED Course  Procedures (including critical care time)  Labs Reviewed  WET PREP, GENITAL - Abnormal; Notable for the following:    Trich, Wet Prep MODERATE (*)    Clue Cells Wet Prep HPF POC RARE (*)    WBC, Wet Prep HPF POC MODERATE (*)    All other components within normal limits  URINALYSIS, ROUTINE W REFLEX MICROSCOPIC - Abnormal; Notable for the following:    Hgb urine dipstick SMALL (*)    Leukocytes, UA SMALL (*)    All other components within normal limits  BASIC METABOLIC PANEL - Abnormal; Notable for the following:    Potassium 3.2 (*)    All other components within normal limits  URINE MICROSCOPIC-ADD ON - Abnormal; Notable for the following:    Squamous Epithelial / LPF FEW (*)    All other components within normal limits  GC/CHLAMYDIA PROBE AMP  URINE CULTURE  CBC WITH  DIFFERENTIAL   No results found.   1. Trichomonas vaginitis       MDM  32yo F presents w/ lower abd pain.  Characteristics of pain, including where it originates, are very unclear, but started after urinating and having a BM this afternoon and is minimal unless she is using the bathroom.  Associated w/ increased urinary frequency only.  On exam, afebrile, NAD, abd soft/non-distended, mild suprapubic and LLQ tenderness, nml genitalia.  No bacteruria but urine sent for culture d/t 7-10 WBCs.  Wet prep positive for trich, likely an incidental finding.  Pt treated w/ 2g flagyl.  No indication for imaging at this time. Referred back to her PCP and advised to return for worsening sx.         Otilio Miu, PA-C 10/22/12 [phone]

## 2012-10-22 NOTE — ED Provider Notes (Signed)
Medical screening examination/treatment/procedure(s) were performed by non-physician practitioner and as supervising physician I was immediately available for consultation/collaboration.  Olivia Mackie, MD 10/22/12 [phone]

## 2012-10-23 LAB — URINE CULTURE: Colony Count: 30000

## 2013-09-28 ENCOUNTER — Other Ambulatory Visit: Payer: Self-pay | Admitting: Family Medicine

## 2013-09-28 ENCOUNTER — Other Ambulatory Visit (HOSPITAL_COMMUNITY)
Admission: RE | Admit: 2013-09-28 | Discharge: 2013-09-28 | Disposition: A | Discharge status: Home / Self Care | Payer: Medicaid Other | Source: Ambulatory Visit | Attending: Family Medicine | Admitting: Family Medicine

## 2013-09-28 DIAGNOSIS — Z124 Encounter for screening for malignant neoplasm of cervix: Secondary | ICD-10-CM | POA: Insufficient documentation

## 2013-09-28 DIAGNOSIS — N760 Acute vaginitis: Secondary | ICD-10-CM | POA: Insufficient documentation

## 2013-09-28 DIAGNOSIS — Z113 Encounter for screening for infections with a predominantly sexual mode of transmission: Secondary | ICD-10-CM | POA: Insufficient documentation

## 2013-09-28 DIAGNOSIS — Z1151 Encounter for screening for human papillomavirus (HPV): Secondary | ICD-10-CM | POA: Insufficient documentation

## 2014-05-11 ENCOUNTER — Emergency Department (HOSPITAL_COMMUNITY): Payer: Medicaid Other

## 2014-05-11 ENCOUNTER — Emergency Department (HOSPITAL_COMMUNITY)
Admission: EM | Admit: 2014-05-11 | Discharge: 2014-05-12 | Disposition: A | Discharge status: Home / Self Care | Payer: Medicaid Other | Attending: Emergency Medicine | Admitting: Emergency Medicine

## 2014-05-11 ENCOUNTER — Encounter (HOSPITAL_COMMUNITY): Payer: Self-pay | Admitting: Nurse Practitioner

## 2014-05-11 DIAGNOSIS — M545 Low back pain, unspecified: Secondary | ICD-10-CM

## 2014-05-11 DIAGNOSIS — Z79899 Other long term (current) drug therapy: Secondary | ICD-10-CM | POA: Diagnosis not present

## 2014-05-11 DIAGNOSIS — R200 Anesthesia of skin: Secondary | ICD-10-CM

## 2014-05-11 DIAGNOSIS — Z8659 Personal history of other mental and behavioral disorders: Secondary | ICD-10-CM | POA: Diagnosis not present

## 2014-05-11 DIAGNOSIS — R258 Other abnormal involuntary movements: Secondary | ICD-10-CM | POA: Diagnosis not present

## 2014-05-11 DIAGNOSIS — R350 Frequency of micturition: Secondary | ICD-10-CM | POA: Insufficient documentation

## 2014-05-11 DIAGNOSIS — R202 Paresthesia of skin: Secondary | ICD-10-CM | POA: Insufficient documentation

## 2014-05-11 DIAGNOSIS — Z3202 Encounter for pregnancy test, result negative: Secondary | ICD-10-CM | POA: Diagnosis not present

## 2014-05-11 DIAGNOSIS — R292 Abnormal reflex: Secondary | ICD-10-CM

## 2014-05-11 DIAGNOSIS — R209 Unspecified disturbances of skin sensation: Secondary | ICD-10-CM

## 2014-05-11 DIAGNOSIS — M542 Cervicalgia: Secondary | ICD-10-CM | POA: Diagnosis not present

## 2014-05-11 DIAGNOSIS — Z880 Allergy status to penicillin: Secondary | ICD-10-CM | POA: Insufficient documentation

## 2014-05-11 DIAGNOSIS — I10 Essential (primary) hypertension: Secondary | ICD-10-CM | POA: Diagnosis not present

## 2014-05-11 DIAGNOSIS — Z8739 Personal history of other diseases of the musculoskeletal system and connective tissue: Secondary | ICD-10-CM | POA: Diagnosis not present

## 2014-05-11 LAB — COMPREHENSIVE METABOLIC PANEL
ALK PHOS: 61 U/L (ref 39–117)
ALT: 15 U/L (ref 0–35)
AST: 15 U/L (ref 0–37)
Albumin: 3.4 g/dL — ABNORMAL LOW (ref 3.5–5.2)
Anion gap: 14 (ref 5–15)
BUN: 8 mg/dL (ref 6–23)
CO2: 22 meq/L (ref 19–32)
Calcium: 8.9 mg/dL (ref 8.4–10.5)
Chloride: 103 mEq/L (ref 96–112)
Creatinine, Ser: 0.67 mg/dL (ref 0.50–1.10)
GFR calc non Af Amer: >90 mL/min (ref >90)
GLUCOSE: 96 mg/dL (ref 70–99)
POTASSIUM: 3.7 meq/L (ref 3.7–5.3)
SODIUM: 139 meq/L (ref 137–147)
TOTAL PROTEIN: 6.9 g/dL (ref 6.0–8.3)
Total Bilirubin: 0.9 mg/dL (ref 0.3–1.2)

## 2014-05-11 LAB — URINALYSIS, ROUTINE W REFLEX MICROSCOPIC
Bilirubin Urine: NEGATIVE
Glucose, UA: NEGATIVE mg/dL
Ketones, ur: NEGATIVE mg/dL
NITRITE: NEGATIVE
PH: 6.5 (ref 5.0–8.0)
Protein, ur: NEGATIVE mg/dL
SPECIFIC GRAVITY, URINE: 1.02 (ref 1.005–1.030)
Urobilinogen, UA: 0.2 mg/dL (ref 0.0–1.0)

## 2014-05-11 LAB — URINE MICROSCOPIC-ADD ON

## 2014-05-11 LAB — CBC
HCT: 36.2 % (ref 36.0–46.0)
HEMOGLOBIN: 13 g/dL (ref 12.0–15.0)
MCH: 31.8 pg (ref 26.0–34.0)
MCHC: 35.9 g/dL (ref 30.0–36.0)
MCV: 88.5 fL (ref 78.0–100.0)
PLATELETS: 312 10*3/uL (ref 150–400)
RBC: 4.09 MIL/uL (ref 3.87–5.11)
RDW: 13 % (ref 11.5–15.5)
WBC: 7.6 10*3/uL (ref 4.0–10.5)

## 2014-05-11 LAB — POC URINE PREG, ED: Preg Test, Ur: NEGATIVE

## 2014-05-11 MED ORDER — GADOBENATE DIMEGLUMINE 529 MG/ML IV SOLN
15.0000 mL | Freq: Once | INTRAVENOUS | Status: AC | PRN
  Administered 2014-05-11: 11 mL via INTRAVENOUS

## 2014-05-11 NOTE — ED Notes (Signed)
She c/o lower back pain, tingling in her hands and feet and urinary frequency x 1 month.

## 2014-05-11 NOTE — ED Provider Notes (Signed)
CSN: [id number]     Arrival date & time 05/11/14  1617 History   First MD Initiated Contact with Patient 05/11/14 1823     Chief Complaint  Patient presents with  . Back Pain  . Numbness  . Urinary Frequency     (Consider location/radiation/quality/duration/timing/severity/associated sxs/prior Treatment) The history is provided by the patient and medical records. No language interpreter was used.     Jacqueline Juarez is a 33 y.o. female  with a hx of anxiety, arthritis, HTN, panic attacks presents to the Emergency Department with multiple complaints.  Pt c/o numbness in the left hand and left foot, not extending past the ankle or wrist rhythm for the last month or so. Patient denies weakness in the left hand or arm and also denies difficulty walking, stumbling or falling. Patient describes her numbness as a tingling sensation that is present persistently without waxing or waning. Patient endorses low back pain for the same amount of time. Patient reports she has not been evaluated for this before. She reports she has a history of hypertension but denies history of diabetes.  No aggravating or alleviating factors.  Patient denies saddle anesthesia, loss of bowel or bladder control however she does endorse urinary frequency and urgency. When questioned patient admits to neck pain. She denies urinary hesitancy or retention. She denies headache, vision changes, chest pain, shortness of breath, abdominal pain, nausea, vomiting, diarrhea, syncope, and vaginal complaints.  Past Medical History  Diagnosis Date  . Anxiety   . Panic   . Arthritis   . Hypertension    History reviewed. No pertinent past surgical history. History reviewed. No pertinent family history. History  Substance Use Topics  . Smoking status: Never Smoker   . Smokeless tobacco: Not on file  . Alcohol Use: Yes     Comment: rare   OB History    No data available     Review of Systems  Constitutional: Negative for  fever, diaphoresis, appetite change, fatigue and unexpected weight change.  HENT: Negative for mouth sores.   Eyes: Negative for visual disturbance.  Respiratory: Negative for cough, chest tightness, shortness of breath and wheezing.   Cardiovascular: Negative for chest pain.  Gastrointestinal: Negative for nausea, vomiting, abdominal pain, diarrhea and constipation.  Endocrine: Negative for polydipsia, polyphagia and polyuria.  Genitourinary: Positive for urgency and frequency. Negative for dysuria, hematuria, decreased urine volume, vaginal bleeding, vaginal discharge and vaginal pain.  Musculoskeletal: Positive for back pain and neck pain. Negative for neck stiffness.  Skin: Negative for rash.  Allergic/Immunologic: Negative for immunocompromised state.  Neurological: Positive for numbness. Negative for syncope, light-headedness and headaches.  Hematological: Does not bruise/bleed easily.  Psychiatric/Behavioral: Negative for sleep disturbance. The patient is not nervous/anxious.       Allergies  Penicillins  Home Medications   Prior to Admission medications   Medication Sig Start Date End Date Taking? Authorizing Provider  Multiple Vitamin (MULTIVITAMIN WITH MINERALS) TABS Take 1 tablet by mouth every morning.   Yes Historical Provider, MD  potassium chloride SA (K-DUR,KLOR-CON) 20 MEQ tablet Take 20 mEq by mouth every morning.   Yes Historical Provider, MD  PRESCRIPTION MEDICATION Take 1 tablet by mouth daily.   Yes Historical Provider, MD  PRESCRIPTION MEDICATION Apply 1 application topically daily.   Yes Historical Provider, MD   BP 131/72 mmHg  Pulse 69  Temp(Src) 99.6 F (37.6 C) (Oral)  Resp 16  SpO2 100% Physical Exam  Constitutional: She is oriented to person, place,  and time. She appears well-developed and well-nourished. No distress.  HENT:  Head: Normocephalic and atraumatic.  Mouth/Throat: Oropharynx is clear and moist. No oropharyngeal exudate.  Lip smacking   Eyes: Conjunctivae and EOM are normal. Pupils are equal, round, and reactive to light. No scleral icterus.  No horizontal, vertical or rotational nystagmus  Neck: Normal range of motion. Neck supple.  Full active and passive ROM without pain No midline or paraspinal tenderness No nuchal rigidity or meningeal signs  Cardiovascular: Normal rate, regular rhythm, normal heart sounds and intact distal pulses.   No murmur heard. RRR  Pulmonary/Chest: Effort normal and breath sounds normal. No respiratory distress. She has no wheezes. She has no rales.  Clear and equal breath sounds  Abdominal: Soft. Bowel sounds are normal. She exhibits no distension. There is no tenderness. There is no rebound and no guarding.  abd soft and nontender  Musculoskeletal: Normal range of motion.  Full range of motion of the T-spine and L-spine No tenderness to palpation of the spinous processes of the T-spine or L-spine Mild tenderness to palpation of the paraspinous muscles of the L-spine  Lymphadenopathy:    She has no cervical adenopathy.  Neurological: She is alert and oriented to person, place, and time. She has normal reflexes. No cranial nerve deficit. She exhibits normal muscle tone. Coordination normal.  Reflex Scores:      Bicep reflexes are 2+ on the right side and 2+ on the left side.      Brachioradialis reflexes are 2+ on the right side and 2+ on the left side.      Patellar reflexes are 2+ on the right side and 2+ on the left side.      Achilles reflexes are 2+ on the right side and 2+ on the left side. Mental Status:  Alert, oriented, thought content appropriate. Speech fluent without evidence of aphasia. Able to follow 2 step commands without difficulty.  Cranial Nerves:  II:  Peripheral visual fields grossly normal, pupils equal, round, reactive to light III,IV, VI: ptosis not present, extra-ocular motions intact bilaterally  V,VII: smile symmetric, facial light touch sensation equal VIII:  hearing grossly normal bilaterally  IX,X: gag reflex present  XI: bilateral shoulder shrug equal and strong XII: midline tongue extension  Motor:  5/5 in upper and lower extremities bilaterally including strong and equal grip strength and dorsiflexion/plantar flexion Sensory: Pinprick and light touch normal in all extremities.  Deep Tendon Reflexes: 2+ and symmetric in the upper extremities; 4+ hyperreflexia in the BLE  Cerebellar: normal finger-to-nose with bilateral upper extremities Gait: normal gait and balance CV: distal pulses palpable throughout  Significant clonus bilaterally   Skin: Skin is warm and dry. No rash noted. She is not diaphoretic. No erythema.  Psychiatric: She has a normal mood and affect. Her behavior is normal. Judgment and thought content normal.  Nursing note and vitals reviewed.   ED Course  Procedures (including critical care time) Labs Review Labs Reviewed  URINALYSIS, ROUTINE W REFLEX MICROSCOPIC - Abnormal; Notable for the following:    APPearance HAZY (*)    Hgb urine dipstick MODERATE (*)    Leukocytes, UA TRACE (*)    All other components within normal limits  URINE MICROSCOPIC-ADD ON - Abnormal; Notable for the following:    Squamous Epithelial / LPF FEW (*)    Bacteria, UA FEW (*)    All other components within normal limits  COMPREHENSIVE METABOLIC PANEL - Abnormal; Notable for the following:  Albumin 3.4 (*)    All other components within normal limits  URINE CULTURE  CBC  POC URINE PREG, ED    Imaging Review Mr Laqueta Jean Wo Contrast  05/12/2014   CLINICAL DATA:  Initial evaluation for hyper reflexia, numbness and tingling in bilateral hands. Evaluate for possible demyelinating disease.  EXAM: MRI HEAD WITHOUT AND WITH CONTRAST  MRI CERVICAL SPINE WITHOUT AND WITH CONTRAST  TECHNIQUE: Multiplanar, multiecho pulse sequences of the brain and surrounding structures, and cervical spine, to include the craniocervical junction and  cervicothoracic junction, were obtained without and with intravenous contrast.  CONTRAST:  11mL MULTIHANCE GADOBENATE DIMEGLUMINE 529 MG/ML IV SOLN  COMPARISON:  None.  FINDINGS: MRI HEAD FINDINGS  The CSF containing spaces are within normal limits for patient age. No focal parenchymal signal abnormality is identified. No mass lesion, midline shift, or extra-axial fluid collection. Ventricles are normal in size without evidence of hydrocephalus.  No abnormal enhancement on post-contrast sequences.  No diffusion-weighted signal abnormality is identified to suggest acute intracranial infarct. Gray-white matter differentiation is maintained. Normal flow voids are seen within the intracranial vasculature. No intracranial hemorrhage identified.  The cervicomedullary junction is normal. Pituitary gland is within normal limits. Pituitary stalk is midline. The globes and optic nerves demonstrate a normal appearance with normal signal intensity.  The bone marrow signal intensity is normal. Calvarium is intact. Degenerative changes noted about the C1-2 articulation. Visualized upper cervical spine otherwise within normal limits.  Scalp soft tissues are unremarkable.  Paranasal sinuses are clear.  No mastoid effusion.  MRI CERVICAL SPINE FINDINGS  There is straightening of the normal cervical lordosis, which may be related to patient positioning. No listhesis. Vertebral body heights are preserved.  Signal intensity within the vertebral body bone marrow is normal. No focal osseous lesion.  Signal intensity within the cervical spinal cord is within normal limits. No evidence for cord compression. No abnormal T2 hyperintense foci seen to suggest demyelination. No abnormal enhancement on post-contrast sequences.  Paraspinous soft tissues within normal limits. Normal intravascular flow voids seen within the vertebral arteries bilaterally.  C2-3:  Negative  C3-4:  Negative.  C4-5: Mild bilateral uncovertebral spurring without  significant stenosis.  C5-6: Mild bilateral uncovertebral spurring without significant stenosis.  C6-7:  Negative.  C7-T1:  Negative.  IMPRESSION: MRI HEAD IMPRESSION:  Normal brain MRI with no acute intracranial abnormality identified. No MRI evidence for demyelinating disease.  MRI CERVICAL SPINE IMPRESSION:  1. No MRI evidence for demyelinating disease identified within the cervical spine. No cord compression. 2. Mild bilateral uncovertebral spurring at C4-5 and C5-6 without significant stenosis.   Electronically Signed   By: Rise Mu M.D.   On: 05/12/2014 00:25   Mr Cervical Spine W Wo Contrast  05/12/2014   CLINICAL DATA:  Initial evaluation for hyper reflexia, numbness and tingling in bilateral hands. Evaluate for possible demyelinating disease.  EXAM: MRI HEAD WITHOUT AND WITH CONTRAST  MRI CERVICAL SPINE WITHOUT AND WITH CONTRAST  TECHNIQUE: Multiplanar, multiecho pulse sequences of the brain and surrounding structures, and cervical spine, to include the craniocervical junction and cervicothoracic junction, were obtained without and with intravenous contrast.  CONTRAST:  11mL MULTIHANCE GADOBENATE DIMEGLUMINE 529 MG/ML IV SOLN  COMPARISON:  None.  FINDINGS: MRI HEAD FINDINGS  The CSF containing spaces are within normal limits for patient age. No focal parenchymal signal abnormality is identified. No mass lesion, midline shift, or extra-axial fluid collection. Ventricles are normal in size without evidence of hydrocephalus.  No abnormal  enhancement on post-contrast sequences.  No diffusion-weighted signal abnormality is identified to suggest acute intracranial infarct. Gray-white matter differentiation is maintained. Normal flow voids are seen within the intracranial vasculature. No intracranial hemorrhage identified.  The cervicomedullary junction is normal. Pituitary gland is within normal limits. Pituitary stalk is midline. The globes and optic nerves demonstrate a normal appearance with  normal signal intensity.  The bone marrow signal intensity is normal. Calvarium is intact. Degenerative changes noted about the C1-2 articulation. Visualized upper cervical spine otherwise within normal limits.  Scalp soft tissues are unremarkable.  Paranasal sinuses are clear.  No mastoid effusion.  MRI CERVICAL SPINE FINDINGS  There is straightening of the normal cervical lordosis, which may be related to patient positioning. No listhesis. Vertebral body heights are preserved.  Signal intensity within the vertebral body bone marrow is normal. No focal osseous lesion.  Signal intensity within the cervical spinal cord is within normal limits. No evidence for cord compression. No abnormal T2 hyperintense foci seen to suggest demyelination. No abnormal enhancement on post-contrast sequences.  Paraspinous soft tissues within normal limits. Normal intravascular flow voids seen within the vertebral arteries bilaterally.  C2-3:  Negative  C3-4:  Negative.  C4-5: Mild bilateral uncovertebral spurring without significant stenosis.  C5-6: Mild bilateral uncovertebral spurring without significant stenosis.  C6-7:  Negative.  C7-T1:  Negative.  IMPRESSION: MRI HEAD IMPRESSION:  Normal brain MRI with no acute intracranial abnormality identified. No MRI evidence for demyelinating disease.  MRI CERVICAL SPINE IMPRESSION:  1. No MRI evidence for demyelinating disease identified within the cervical spine. No cord compression. 2. Mild bilateral uncovertebral spurring at C4-5 and C5-6 without significant stenosis.   Electronically Signed   By: Rise Mu M.D.   On: 05/12/2014 00:25     EKG Interpretation None      MDM   Final diagnoses:  Clonus  Neck pain  Hyperreflexia  Numbness and tingling in left hand  Numbness and tingling in right hand  Bilateral low back pain without sciatica   Jacqueline Juarez presents with multiple neurologic complaints with an abnormal neurologic exam.  Pt complains of numbness  to the left hand and foot. On exam she is noted to have tardive dyskinesia, hyperreflexia of the bilateral lower extremities and clonus bilaterally.  Sensation is grossly intact throughout.  Will consult neurology.    The patient was discussed with and seen by Dr. Denton Lank who agrees with the treatment plan.  8:56PM Pt discussed with Dr. Hosie Poisson who had evaluated the patient and recommends MRI brain and cervical spine with and without contrast.    11:31 PM Pt remains in MRI.    12:30 AM MRI without acute findings, specifically no demyelinating disease.  Patient ambulatory without difficulty or assistance in the emergency department. Discharge home with plan for close neurology follow-up.    I have personally reviewed patient's vitals, nursing note and any pertinent labs or imaging.  I performed an undressed physical exam.    It has been determined that no acute conditions requiring further emergency intervention are present at this time. The patient/guardian have been advised of the diagnosis and plan. I reviewed all labs and imaging including any potential incidental findings. We have discussed signs and symptoms that warrant return to the ED and they are listed in the discharge instructions.    Vital signs are stable at discharge.   BP 131/72 mmHg  Pulse 69  Temp(Src) 99.6 F (37.6 C) (Oral)  Resp 16  SpO2 100%  Dahlia ClientHannah Ved Martos, PA-C 05/12/14 0038  Suzi RootsKevin E Steinl, MD 05/13/14 [phone]

## 2014-05-11 NOTE — Consult Note (Signed)
Consult Reason for Consult:paresthesias and back pain Referring Physician: Dr Denton LankSteinl  CC: paresthesias  HPI: Jacqueline Juarez is an 33 y.o. female with a hx of anxiety, arthritis, HTN, panic attacks presents to the Emergency Department with multiple complaints. Pt c/o numbness in the left hand and left foot, not extending past the ankle or wrist rhythm for the last 2 months or so. This has not gotten worse. Patient denies weakness in the left hand or arm and also denies difficulty walking, stumbling or falling. Patient describes her numbness as a tingling sensation that is present persistently without waxing or waning. Patient endorses low back pain for the same amount of time. She feels this back pain has been getting progressively worse. Patient reports she has not been evaluated for this before. She reports she has a history of hypertension but denies history of diabetes. No aggravating or alleviating factors. Patient denies saddle anesthesia, loss of bowel or bladder control. When questioned patient admits to neck pain. . She denies headache, vision changes. No history of trauma to her head or neck.   Past Medical History  Diagnosis Date  . Anxiety   . Panic   . Arthritis   . Hypertension     History reviewed. No pertinent past surgical history.  History reviewed. No pertinent family history.  Social History:  reports that she has never smoked. She does not have any smokeless tobacco history on file. She reports that she drinks alcohol. She reports that she does not use illicit drugs.  Allergies  Allergen Reactions  . Penicillins Hives    Medications: I have reviewed the patient's current medications.   ROS: Out of a complete 14 system review, the patient complains of only the following symptoms, and all other reviewed systems are negative. + tingling, back pain  Physical Examination: Filed Vitals:   05/11/14 2015  BP: 126/79  Pulse: 68  Temp:   Resp:    Physical  Exam  Constitutional: He appears well-developed and well-nourished.  Psych: Affect appropriate to situation Eyes: No scleral injection HENT: No OP obstrucion Head: Normocephalic.  Cardiovascular: Normal rate and regular rhythm.  Respiratory: Effort normal and breath sounds normal.  GI: Soft. Bowel sounds are normal. No distension. There is no tenderness.  Skin: WDI  Neurologic Examination Mental Status: Alert, oriented, thought content appropriate.  Speech fluent without evidence of aphasia.  Able to follow 3 step commands without difficulty. Cranial Nerves: II: funduscopic exam wnl bilaterally, visual fields grossly normal, pupils equal, round, reactive to light and accommodation III,IV, VI: ptosis not present, extra-ocular motions intact bilaterally V,VII: smile symmetric, facial light touch sensation normal bilaterally VIII: hearing normal bilaterally IX,X: gag reflex present XI: trapezius strength/neck flexion strength normal bilaterally XII: tongue strength normal (intermittent tongue chewing movement, goes away with distraction and patient able to suppress).  Motor: Right UE and RLE 5/5 strength. Proximal LUE 5-/5, distal LUE 5/5 strength. Proximal LLE 5-/5, distally 5/5  Sensory: Pinprick and light touch intact throughout, bilaterally Deep Tendon Reflexes: 3+ L biceps and brachioradialis, 2+ R biceps and brachioradialis. 3+ bilateral patellar with crossed adductor response, 2-3 beats clonus bilat AJ  Plantars: Right: downgoing   Left: upgoing Cerebellar: normal finger-to-nose, normal rapid alternating movements and normal heel-to-shin test Gait: deferred due to multiple leads on in ED  Laboratory Studies:   Basic Metabolic Panel:  Recent Labs Lab 05/11/14 1935  NA 139  K 3.7  CL 103  CO2 22  GLUCOSE 96  BUN 8  CREATININE 0.67  CALCIUM 8.9    Liver Function Tests:  Recent Labs Lab 05/11/14 1935  AST 15  ALT 15  ALKPHOS 61  BILITOT 0.9  PROT 6.9   ALBUMIN 3.4*   No results for input(s): LIPASE, AMYLASE in the last 168 hours. No results for input(s): AMMONIA in the last 168 hours.  CBC:  Recent Labs Lab 05/11/14 1935  WBC 7.6  HGB 13.0  HCT 36.2  MCV 88.5  PLT 312    Cardiac Enzymes: No results for input(s): CKTOTAL, CKMB, CKMBINDEX, TROPONINI in the last 168 hours.  BNP: Invalid input(s): POCBNP  CBG: No results for input(s): GLUCAP in the last 168 hours.  Microbiology: Results for orders placed or performed during the hospital encounter of 10/21/12  Urine culture     Status: None   Collection Time: 10/21/12 10:27 PM  Result Value Ref Range Status   Specimen Description URINE, CLEAN CATCH  Final   Special Requests NONE  Final   Culture  Setup Time 10/22/2012 13:18  Final   Colony Count 30,000 COLONIES/ML  Final   Culture   Final    Multiple bacterial morphotypes present, none predominant. Suggest appropriate recollection if clinically indicated.   Report Status 10/23/2012 FINAL  Final  Wet prep, genital     Status: Abnormal   Collection Time: 10/22/12  1:19 AM  Result Value Ref Range Status   Yeast Wet Prep HPF POC NONE SEEN NONE SEEN Final   Trich, Wet Prep MODERATE (A) NONE SEEN Final   Clue Cells Wet Prep HPF POC RARE (A) NONE SEEN Final   WBC, Wet Prep HPF POC MODERATE (A) NONE SEEN Final    Coagulation Studies: No results for input(s): LABPROT, INR in the last 72 hours.  Urinalysis:  Recent Labs Lab 05/11/14 1629  COLORURINE YELLOW  LABSPEC 1.020  PHURINE 6.5  GLUCOSEU NEGATIVE  HGBUR MODERATE*  BILIRUBINUR NEGATIVE  KETONESUR NEGATIVE  PROTEINUR NEGATIVE  UROBILINOGEN 0.2  NITRITE NEGATIVE  LEUKOCYTESUR TRACE*    Lipid Panel:  No results found for: CHOL, TRIG, HDL, CHOLHDL, VLDL, LDLCALC  HgbA1C: No results found for: HGBA1C  Urine Drug Screen:  No results found for: LABOPIA, COCAINSCRNUR, LABBENZ, AMPHETMU, THCU, LABBARB  Alcohol Level: No results for input(s): ETH in the last  168 hours.   Imaging: No results found.   Assessment/Plan:  33y/o woman presenting with 2 month history of LUE and LLE paresthesias and lumbar back pain. Exam pertinent for mild LUE weakness and brisk reflexes throughout. Constellation of symptoms raises concern for possible MS. Would check MRI brain and C spine with and without contrast. If unremarkable can follow up with outpatient neurology for further workup as symptoms have been ongoing for ~2 months.   Jacqueline Choeter Tanyla Stege, DO [facility] [phone]  If 7pm- 7am, please page neurology on call as listed in [facility]ION. 05/11/2014, 8:38 PM

## 2014-05-11 NOTE — ED Notes (Signed)
Pt returned from MRI °

## 2014-05-12 LAB — URINE CULTURE: Colony Count: >=100000

## 2014-05-12 NOTE — Discharge Instructions (Signed)
1. Medications: usual home medications 2. Treatment: rest, drink plenty of fluids,  3. Follow Up: Please followup with the referral to neurology in 3 days for discussion of your diagnoses and further evaluation after today's visit; if you do not have a primary care doctor use the resource guide provided to find one; Please return to the ER for worsening symptoms

## 2014-05-23 ENCOUNTER — Ambulatory Visit: Payer: Medicaid Other | Admitting: Neurology

## 2014-06-06 ENCOUNTER — Encounter: Payer: Self-pay | Admitting: Neurology

## 2014-06-06 ENCOUNTER — Ambulatory Visit (INDEPENDENT_AMBULATORY_CARE_PROVIDER_SITE_OTHER): Payer: Medicaid Other | Admitting: Neurology

## 2014-06-06 VITALS — BP 139/89 | HR 72 | Ht 64.0 in | Wt 237.0 lb

## 2014-06-06 DIAGNOSIS — R202 Paresthesia of skin: Secondary | ICD-10-CM

## 2014-06-06 DIAGNOSIS — M545 Low back pain, unspecified: Secondary | ICD-10-CM

## 2014-06-06 NOTE — Progress Notes (Signed)
PATIENT: Jacqueline Juarez DOB: Jun 14, 1981  HISTORICAL  Jacqueline Juarez is a 33 yo RH female, referred by emergency room, and her primary care physician Dr. Parke SimmersBland for evaluation of constellation of complaints.  She had a past medical history of depression anxiety, is on disability due to her mood disorder, lives with her teenager children, she does not drive.  She has a history of chronic low back pain, she presented to emergency room in November 20 first 2015, with constellation of complains, numbness in her left hand, left foot, intermittent, worsening low back pain, was evaluated by neuro hospitalist Dr. Hosie PoissonSumner,  MRI of the brain, and cervical spine with without contrast showed no significant abnormality Lab, normal CBC, CMP  Her symptoms has improved some over the past few weeks, but she continue to complain intermittent left hand, bilateral foot numbness, gait difficulty due to low back pain  REVIEW OF SYSTEMS: Full 14 system review of systems performed and notable only for joints pain, joint swelling, achy muscles, weakness  ALLERGIES: Allergies  Allergen Reactions  . Penicillins Hives    HOME MEDICATIONS: Current Outpatient Prescriptions on File Prior to Visit  Medication Sig Dispense Refill  . Multiple Vitamin (MULTIVITAMIN WITH MINERALS) TABS Take 1 tablet by mouth every morning.    . potassium chloride SA (K-DUR,KLOR-CON) 20 MEQ tablet Take 20 mEq by mouth every morning.    Marland Kitchen. PRESCRIPTION MEDICATION Take 1 tablet by mouth daily.    Marland Kitchen. PRESCRIPTION MEDICATION Apply 1 application topically daily.     No current facility-administered medications on file prior to visit.    PAST MEDICAL HISTORY: Past Medical History  Diagnosis Date  . Anxiety   . Panic   . Arthritis   . Hypertension   . High cholesterol     PAST SURGICAL HISTORY: Past Surgical History  Procedure Laterality Date  . No past surgeries      FAMILY HISTORY: History reviewed. No pertinent  family history.  SOCIAL HISTORY:  History   Social History  . Marital Status: Single    Spouse Name: N/A    Number of Children: 2  . Years of Education: 9th   Occupational History    disabled   Social History Main Topics  . Smoking status: Never Smoker   . Smokeless tobacco: Never Used  . Alcohol Use: Yes     Comment: rare  . Drug Use: No  . Sexual Activity: Not on file   Other Topics Concern  . Not on file   Social History Narrative   Patient is single and lives at home with her children.    Patient is disabled.   Education 9 th grade.   Right handed.   Caffeine sweet tea two bottles daily.     PHYSICAL EXAM   Filed Vitals:   06/06/14 0947  BP: 139/89  Pulse: 72  Height: 5\' 4"  (1.626 m)  Weight: 237 lb (107.502 kg)    Not recorded      Body mass index is 40.66 kg/(m^2).   Generalized: In no acute distress  Neck: Supple, no carotid bruits   Cardiac: Regular rate rhythm  Pulmonary: Clear to auscultation bilaterally  Musculoskeletal: No deformity  Neurological examination  Mentation: Alert oriented to time, place, history taking, and causual conversation, endenture  Cranial nerve II-XII: Pupils were equal round reactive to light. Extraocular movements were full.  Visual field were full on confrontational test. Bilateral fundi were sharp.  Facial sensation and strength were normal.  Hearing was intact to finger rubbing bilaterally. Uvula tongue midline.  Head turning and shoulder shrug and were normal and symmetric.Tongue protrusion into cheek strength was normal.  Motor: Normal tone, bulk and strength.  Sensory: Intact to fine touch, pinprick, preserved vibratory sensation, and proprioception at toes.  Coordination: Normal finger to nose, heel-to-shin bilaterally there was no truncal ataxia  Gait: Pushing on chair arm to get up from seated position, cautious, mildly unsteady Romberg signs: Negative  Deep tendon reflexes: Brachioradialis 3/3,  biceps 3/3, triceps 3/3, patellar 3/3, Achilles 2/2, plantar responses were flexor bilaterally.   DIAGNOSTIC DATA (LABS, IMAGING, TESTING) - I reviewed patient records, labs, notes, testing and imaging myself where available.  Lab Results  Component Value Date   WBC 7.6 05/11/2014   HGB 13.0 05/11/2014   HCT 36.2 05/11/2014   MCV 88.5 05/11/2014   PLT 312 05/11/2014      Component Value Date/Time   NA 139 05/11/2014 1935   K 3.7 05/11/2014 1935   CL 103 05/11/2014 1935   CO2 22 05/11/2014 1935   GLUCOSE 96 05/11/2014 1935   BUN 8 05/11/2014 1935   CREATININE 0.67 05/11/2014 1935   CALCIUM 8.9 05/11/2014 1935   PROT 6.9 05/11/2014 1935   ALBUMIN 3.4* 05/11/2014 1935   AST 15 05/11/2014 1935   ALT 15 05/11/2014 1935   ALKPHOS 61 05/11/2014 1935   BILITOT 0.9 05/11/2014 1935   GFRNONAA >90 05/11/2014 1935   GFRAA >90 05/11/2014 1935    ASSESSMENT AND PLAN  Jacqueline Juarez is a 33 y.o. female with history of mood disorder, with constellation of complains, including intermittent left hands, bilateral feet paresthesia, hyperreflexia on examinations, nonsignificant MRI, cervical spine,  Differentiation diagnosis including musculoskeletal etiology, need to rule out peripheral neuropathy, carpal tunnel syndromes EMG nerve conduction study When necessary NSAIDs   [name], M.D. Ph.D.  [facility] [address], [address] [place], [place] [phone]

## 2014-06-25 ENCOUNTER — Encounter: Payer: Medicaid Other | Admitting: Neurology

## 2014-07-02 ENCOUNTER — Encounter (INDEPENDENT_AMBULATORY_CARE_PROVIDER_SITE_OTHER): Payer: Self-pay | Admitting: Neurology

## 2014-07-02 ENCOUNTER — Ambulatory Visit (INDEPENDENT_AMBULATORY_CARE_PROVIDER_SITE_OTHER): Payer: Medicaid Other | Admitting: Neurology

## 2014-07-02 DIAGNOSIS — M545 Low back pain, unspecified: Secondary | ICD-10-CM

## 2014-07-02 DIAGNOSIS — R269 Unspecified abnormalities of gait and mobility: Secondary | ICD-10-CM | POA: Insufficient documentation

## 2014-07-02 DIAGNOSIS — Z0289 Encounter for other administrative examinations: Secondary | ICD-10-CM

## 2014-07-02 DIAGNOSIS — R202 Paresthesia of skin: Secondary | ICD-10-CM

## 2014-07-02 NOTE — Progress Notes (Signed)
PATIENT: Jacqueline Juarez DOB: [birth year]  HISTORICAL  Jacqueline Juarez is a 34 yo RH female, referred by emergency room, and her primary care physician Dr. Parke Simmers for evaluation of constellation of complaints.  She had a past medical history of depression anxiety, is on disability due to her mood disorder, lives with her teenager children, she does not drive.  She has a history of chronic low back pain, she presented to emergency room in November 20 first 2015, with constellation of complains, numbness in her left hand, left foot, intermittent, worsening low back pain, was evaluated by neuro hospitalist Dr. Hosie Poisson,  MRI of the brain, and cervical spine with without contrast showed no significant abnormality Lab, normal CBC, CMP  Her symptoms has improved some over the past few weeks, but she continue to complain intermittent left hand, bilateral foot numbness, gait difficulty due to low back pain  UPDATE January twelfth 2016:  Electrodiagnostic study in July 02 2014 showed no evidence of large fiber peripheral neuropathy, mild to moderate bilateral carpal tunnel syndromes,  She continue have gait difficulty, dragging her right leg across the floor, she denies bowel and bladder incontinence. She does has frequent urinary urgency  REVIEW OF SYSTEMS: Full 14 system review of systems performed and notable only for joints pain, joint swelling, achy muscles, weakness  ALLERGIES: Allergies  Allergen Reactions  . Penicillins Hives    HOME MEDICATIONS: Current Outpatient Prescriptions on File Prior to Visit  Medication Sig Dispense Refill  . clotrimazole-betamethasone (LOTRISONE) cream Apply 1 application topically 2 (two) times daily.    . hydrochlorothiazide (MICROZIDE) 12.5 MG capsule Take 12.5 mg by mouth daily.    . Multiple Vitamin (MULTIVITAMIN WITH MINERALS) TABS Take 1 tablet by mouth every morning.    . potassium chloride SA (K-DUR,KLOR-CON) 20 MEQ tablet Take 20 mEq  by mouth every morning.    Marland Kitchen PRESCRIPTION MEDICATION Take 1 tablet by mouth daily.    Marland Kitchen PRESCRIPTION MEDICATION Apply 1 application topically daily.     No current facility-administered medications on file prior to visit.    PAST MEDICAL HISTORY: Past Medical History  Diagnosis Date  . Anxiety   . Panic   . Arthritis   . Hypertension   . High cholesterol     PAST SURGICAL HISTORY: Past Surgical History  Procedure Laterality Date  . No past surgeries      FAMILY HISTORY: No family history on file.  SOCIAL HISTORY:  History   Social History  . Marital Status: Single    Spouse Name: N/A    Number of Children: 2  . Years of Education: 9th   Occupational History    disabled   Social History Main Topics  . Smoking status: Never Smoker   . Smokeless tobacco: Never Used  . Alcohol Use: Yes     Comment: rare  . Drug Use: No  . Sexual Activity: Not on file   Other Topics Concern  . Not on file   Social History Narrative   Patient is single and lives at home with her children.    Patient is disabled.   Education 9 th grade.   Right handed.   Caffeine sweet tea two bottles daily.     PHYSICAL EXAM   There were no vitals filed for this visit.  Not recorded      There is no weight on file to calculate BMI.   Generalized: In no acute distress  Neck: Supple, no carotid bruits  Cardiac: Regular rate rhythm  Pulmonary: Clear to auscultation bilaterally  Musculoskeletal: No deformity  Neurological examination  Mentation: Alert oriented to time, place, history taking, and causual conversation, endenture  Cranial nerve II-XII: Pupils were equal round reactive to light. Extraocular movements were full.  Visual field were full on confrontational test. Bilateral fundi were sharp.  Facial sensation and strength were normal. Hearing was intact to finger rubbing bilaterally. Uvula tongue midline.  Head turning and shoulder shrug and were normal and  symmetric.Tongue protrusion into cheek strength was normal.  Motor: Normal tone, bulk and strength.  Sensory: Intact to fine touch, pinprick, preserved vibratory sensation, and proprioception at toes.  Coordination: Normal finger to nose, heel-to-shin bilaterally there was no truncal ataxia  Gait: Pushing on chair arm to get up from seated position, cautious, mildly unsteady, dragging her right leg across the floor Romberg signs: Negative  Deep tendon reflexes: Brachioradialis 3/3, biceps 3/3, triceps 3/3, patellar 3/3, Achilles 2/2, plantar responses were flexor bilaterally.   DIAGNOSTIC DATA (LABS, IMAGING, TESTING) - I reviewed patient records, labs, notes, testing and imaging myself where available.  Lab Results  Component Value Date   WBC 7.6 05/11/2014   HGB 13.0 05/11/2014   HCT 36.2 05/11/2014   MCV 88.5 05/11/2014   PLT 312 05/11/2014      Component Value Date/Time   NA 139 05/11/2014 1935   K 3.7 05/11/2014 1935   CL 103 05/11/2014 1935   CO2 22 05/11/2014 1935   GLUCOSE 96 05/11/2014 1935   BUN 8 05/11/2014 1935   CREATININE 0.67 05/11/2014 1935   CALCIUM 8.9 05/11/2014 1935   PROT 6.9 05/11/2014 1935   ALBUMIN 3.4* 05/11/2014 1935   AST 15 05/11/2014 1935   ALT 15 05/11/2014 1935   ALKPHOS 61 05/11/2014 1935   BILITOT 0.9 05/11/2014 1935   GFRNONAA >90 05/11/2014 1935   GFRAA >90 05/11/2014 1935    ASSESSMENT AND PLAN  Henri D Mayford KnifeWilliams is a 34 y.o. female with history of mood disorder, with constellation of complains, including intermittent left hands, bilateral feet paresthesia, hyperreflexia on examinations, nonsignificant MRI, cervical spine,  MRI of thoracic spine, lumbar spine, to rule out spinal stenosis Return to clinic in 3-4 weeks   Levert FeinsteinYijun Wilbon Obenchain, M.D. Ph.D.  [facility] [address], [address] [place], [place] [address] [phone]

## 2014-07-02 NOTE — Procedures (Signed)
   NCS (NERVE CONDUCTION STUDY) WITH EMG (ELECTROMYOGRAPHY) REPORT   STUDY DATE: July 02 2013 PATIENT NAME: Jacqueline Juarez DOB: 1981/06/01 MRN: [id number]    TECHNOLOGIST: Kaylyn LimSue Fox ELECTROMYOGRAPHER: Levert FeinsteinYan, Lennan Malone M.D.  CLINICAL INFORMATION:  34 years old female, presenting with gradual onset gait difficulty, bilateral feet paresthesia, left arm difficulty, hyperreflexia on examinations,   FINDINGS: NERVE CONDUCTION STUDY: Right sural sensory response showed mildly decreased snap amplitude. Left sural sensory response was normal. Right peroneal motor response showed normal C amplitude at distal stimulation side, decreased C map amplitude at proximal stimulation side, this could due to her big body habitus  Bilateral ulnar sensory and motor responses were normal. Bilateral median sensory showed mildly prolonged peak latency, with preserved snap amplitude. bilateral median motor responses showed mildly prolonged distal latency , with normal C map amplitude, conduction velocity  NEEDLE ELECTROMYOGRAPHY:  Selected needle examination was performed at left upper, bilateral lower extremity muscles, left cervical, and bilateral lumbosacral paraspinal muscles   Needle examination of left extensor digital commonness, biceps, triceps, pronator teres was normal  There was no spontaneous activity at left lumbosacral paraspinal muscles.  Needle examination of left tibialis anterior, tibialis posterior, vastus lateralis, peroneal longus, biceps femoris long head was normal  Right tibialis anterior, peroneal longus, normally insertion activity, no spontaneous activity, mild enlarged motor unit potential, with mildly decreased recruitment patterns.  Right tibialis posterior, vastus lateralis, biceps femoris long head was normal  There was no spontaneous activity at bilateral lumbosacral paraspinal muscles, bilateral L4-L5 S1   IMPRESSION: This is an abnormal study. 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   INTERPRETING PHYSICIAN:   Levert FeinsteinYan, Kayon Dozier M.D. Ph.D. [facility] [address], [address] [place], [place] [address] [phone]

## 2014-07-09 ENCOUNTER — Other Ambulatory Visit: Payer: Medicaid Other

## 2014-07-16 ENCOUNTER — Encounter: Payer: Medicaid Other | Admitting: Neurology

## 2014-07-17 ENCOUNTER — Ambulatory Visit: Payer: Medicaid Other | Admitting: Internal Medicine

## 2014-07-19 ENCOUNTER — Ambulatory Visit
Admission: RE | Admit: 2014-07-19 | Discharge: 2014-07-19 | Disposition: A | Discharge status: Home / Self Care | Payer: Medicaid Other | Source: Ambulatory Visit | Attending: Neurology | Admitting: Neurology

## 2014-07-19 DIAGNOSIS — R269 Unspecified abnormalities of gait and mobility: Secondary | ICD-10-CM

## 2014-07-19 DIAGNOSIS — R202 Paresthesia of skin: Secondary | ICD-10-CM

## 2014-07-19 DIAGNOSIS — M545 Low back pain, unspecified: Secondary | ICD-10-CM

## 2014-07-23 ENCOUNTER — Telehealth: Payer: Self-pay | Admitting: Neurology

## 2014-07-23 ENCOUNTER — Telehealth: Payer: Self-pay | Admitting: *Deleted

## 2014-07-23 NOTE — Telephone Encounter (Signed)
Spoke to patient again - she is aware of results.

## 2014-07-23 NOTE — Telephone Encounter (Signed)
Patient calling stating that she received a call about MRI results but she was doing something else and really did not understand what the nurse told her. Please call patient back and explain again thanks

## 2014-07-23 NOTE — Telephone Encounter (Signed)
Patient aware of MRI results - expressed understanding - she will call if needed.

## 2014-07-23 NOTE — Telephone Encounter (Signed)
Michelle:  Please call patient, MRI of lumbar spine showed mild herniated disc at left L5, S1 level, there was no significant foraminal stenosis,  If she remains symptomatic, give her a follow-up visit in 1-2 months, no change in treatment plan in the meantime  Abnormal MRI lumbar spine (without) demonstrating 1. At L5-S1: disc bulging and facet hypertrophy; far left lateral disc bulging displaces the exiting left L5 root; also there is mild biforaminal stenosis.  2. Remaining levels are unremarkable.

## 2014-09-03 ENCOUNTER — Encounter (HOSPITAL_COMMUNITY): Payer: Self-pay | Admitting: *Deleted

## 2014-09-03 ENCOUNTER — Emergency Department (HOSPITAL_COMMUNITY)
Admission: EM | Admit: 2014-09-03 | Discharge: 2014-09-03 | Disposition: A | Discharge status: Home / Self Care | Payer: Medicaid Other | Attending: Emergency Medicine | Admitting: Emergency Medicine

## 2014-09-03 ENCOUNTER — Emergency Department (HOSPITAL_COMMUNITY): Payer: Medicaid Other

## 2014-09-03 DIAGNOSIS — I10 Essential (primary) hypertension: Secondary | ICD-10-CM | POA: Diagnosis not present

## 2014-09-03 DIAGNOSIS — Z8659 Personal history of other mental and behavioral disorders: Secondary | ICD-10-CM | POA: Insufficient documentation

## 2014-09-03 DIAGNOSIS — Z880 Allergy status to penicillin: Secondary | ICD-10-CM | POA: Diagnosis not present

## 2014-09-03 DIAGNOSIS — R0781 Pleurodynia: Secondary | ICD-10-CM | POA: Diagnosis not present

## 2014-09-03 DIAGNOSIS — G8929 Other chronic pain: Secondary | ICD-10-CM | POA: Insufficient documentation

## 2014-09-03 DIAGNOSIS — Z8639 Personal history of other endocrine, nutritional and metabolic disease: Secondary | ICD-10-CM | POA: Insufficient documentation

## 2014-09-03 DIAGNOSIS — M25512 Pain in left shoulder: Secondary | ICD-10-CM | POA: Diagnosis not present

## 2014-09-03 DIAGNOSIS — Z79899 Other long term (current) drug therapy: Secondary | ICD-10-CM | POA: Insufficient documentation

## 2014-09-03 DIAGNOSIS — R21 Rash and other nonspecific skin eruption: Secondary | ICD-10-CM | POA: Diagnosis not present

## 2014-09-03 DIAGNOSIS — R202 Paresthesia of skin: Secondary | ICD-10-CM

## 2014-09-03 DIAGNOSIS — Z8669 Personal history of other diseases of the nervous system and sense organs: Secondary | ICD-10-CM | POA: Insufficient documentation

## 2014-09-03 DIAGNOSIS — R200 Anesthesia of skin: Secondary | ICD-10-CM | POA: Diagnosis present

## 2014-09-03 HISTORY — DX: Dorsalgia, unspecified: M54.9

## 2014-09-03 HISTORY — DX: Polyneuropathy, unspecified: G62.9

## 2014-09-03 HISTORY — DX: Radiculopathy, lumbar region: M54.16

## 2014-09-03 HISTORY — DX: Other chronic pain: G89.29

## 2014-09-03 HISTORY — DX: Paresthesia of skin: R20.2

## 2014-09-03 MED ORDER — PREDNISONE 20 MG PO TABS
60.0000 mg | ORAL_TABLET | Freq: Once | ORAL | Status: AC
  Administered 2014-09-03: 60 mg via ORAL
  Filled 2014-09-03: qty 3

## 2014-09-03 MED ORDER — PREDNISONE 10 MG PO TABS: ORAL_TABLET | ORAL | Status: DC

## 2014-09-03 MED ORDER — OXYCODONE-ACETAMINOPHEN 5-325 MG PO TABS
1.0000 | ORAL_TABLET | Freq: Once | ORAL | Status: AC
  Administered 2014-09-03: 1 via ORAL
  Filled 2014-09-03: qty 1

## 2014-09-03 MED ORDER — HYDROCODONE-ACETAMINOPHEN 5-325 MG PO TABS: 1.0000 | ORAL_TABLET | Freq: Four times a day (QID) | ORAL | Status: DC | PRN

## 2014-09-03 NOTE — ED Provider Notes (Signed)
CSN: [id number]     Arrival date & time 09/03/14  1505 History   First MD Initiated Contact with Patient 09/03/14 1832     Chief Complaint  Patient presents with  . Hand Pain     (Consider location/radiation/quality/duration/timing/severity/associated sxs/prior Treatment) HPI Jacqueline Juarez is a 34 y.o. female with history of anxiety, hypertension, peripheral neuropathy with chronic left hand paresthesias, presents to emergency department complaining of left shoulder pain and persistent left hand numbness. She states numbness has gotten better and again worse over the last week. She states she was told she may have carpal tunnel. She states the new complaint today is left shoulder and left chest pain. She states pain started about a week ago. It is tender to palpation and states "it itches. She states pain is worse with movement of the left shoulder. She denies any injuries. She denies taking anything for pain at home. She denies any shortness of breath, chest pain, diaphoresis, dizziness. No prior cardiac history. She also reports rash under bilateral breasts, states she has been putting cream on it that she was prescribed but it is not improving.  Past Medical History  Diagnosis Date  . Anxiety   . Panic   . Arthritis   . Hypertension   . High cholesterol   . Chronic back pain   . Lumbar radiculopathy   . Peripheral neuropathy     left hand  . Paresthesias     left hand, bilat feet   Past Surgical History  Procedure Laterality Date  . No past surgeries     No family history on file. History  Substance Use Topics  . Smoking status: Never Smoker   . Smokeless tobacco: Never Used  . Alcohol Use: Yes     Comment: rare   OB History    No data available     Review of Systems  Constitutional: Negative for fever and chills.  Respiratory: Negative for cough, chest tightness and shortness of breath.   Cardiovascular: Negative for chest pain, palpitations and leg swelling.   Gastrointestinal: Negative for nausea, vomiting, abdominal pain and diarrhea.  Genitourinary: Negative for dysuria and flank pain.  Musculoskeletal: Positive for arthralgias. Negative for myalgias, neck pain and neck stiffness.  Skin: Positive for rash.  Neurological: Positive for numbness. Negative for dizziness, weakness and headaches.  All other systems reviewed and are negative.     Allergies  Penicillins  Home Medications   Prior to Admission medications   Medication Sig Start Date End Date Taking? Authorizing Provider  clotrimazole-betamethasone (LOTRISONE) cream Apply 1 application topically 2 (two) times daily.    Historical Provider, MD  hydrochlorothiazide (MICROZIDE) 12.5 MG capsule Take 12.5 mg by mouth daily.    Historical Provider, MD  Multiple Vitamin (MULTIVITAMIN WITH MINERALS) TABS Take 1 tablet by mouth every morning.    Historical Provider, MD  potassium chloride SA (K-DUR,KLOR-CON) 20 MEQ tablet Take 20 mEq by mouth every morning.    Historical Provider, MD  PRESCRIPTION MEDICATION Take 1 tablet by mouth daily.    Historical Provider, MD  PRESCRIPTION MEDICATION Apply 1 application topically daily.    Historical Provider, MD   BP 144/89 mmHg  Pulse 69  Temp(Src) 98.5 F (36.9 C) (Oral)  Resp 16  SpO2 100% Physical Exam  Constitutional: She appears well-developed and well-nourished. No distress.  Eyes: Conjunctivae are normal.  Neck: Normal range of motion. Neck supple.  No midline tenderness  Cardiovascular: Normal rate, regular rhythm and normal heart  sounds.   Pulmonary/Chest: Effort normal and breath sounds normal. No respiratory distress. She has no wheezes. She has no rales. She exhibits tenderness.  Musculoskeletal:  Tender to palpation over left anterior posterior shoulder joint. Full range of motion actively and passively, pain with full flexion, external and internal rotation of the shoulder. Bicep tricep and deltoid strength intact. Tenderness  extends into the left ribs, and midaxillary line and left periscapular area. Full range of motion over normal strength of the left wrist and all fingers. Decreased sensation on the palmar surface of the left hand compared to the right. Patient is able to do thumbs up, oppose her thumb to every finger, spread all the fingers.  Neurological: She is alert.  Skin: Skin is warm and dry.  Hyperpigmented, with some erythema rash below bilateral breasts.  Nursing note and vitals reviewed.   ED Course  Procedures (including critical care time) Labs Review Labs Reviewed - No data to display  Imaging Review Dg Chest 2 View  09/03/2014   CLINICAL DATA:  Short of breath today. Left hand pain and numbness for 1 month.  EXAM: CHEST  2 VIEW  COMPARISON:  07/31/2009  FINDINGS: Mild cardiomegaly. Normal vascularity. Clear lungs. Very small pleural effusions and basilar atelectasis.  IMPRESSION: Small pleural effusions and mild basilar atelectasis  Cardiomegaly without decompensation.   Electronically Signed   By: Jolaine Click M.D.   On: 09/03/2014 20:07     EKG Interpretation None      MDM   Final diagnoses:  Shoulder pain, acute, left  Paresthesias in left hand    Patient with persistent left shoulder pain and left hand numbness. She has had extensive workup recently in the last 2 months by a neurologist including MRI of the brain, cervical spine, thoracic spine, lumbar spine, nerve conduction studies in the upper and lower extremities. I think her conduction studies were concerning for carpal tunnel. She is now having her producible pain in her shoulder, worsened with palpation of the shoulder movement. She is also having some left rib pain. Chest x-ray obtained in this unremarkable for acute findings. Patient is complaining of pain, Percocet and prednisone given. Will discharge her home with outpatient follow-up, at this time patient is stable, no evidence of infection or any other acute  process.  Filed Vitals:   09/03/14 1516 09/03/14 1857 09/03/14 1926  BP: 141/79 144/89   Pulse: 78 69   Temp: 98.8 F (37.1 C) 98.5 F (36.9 C)   TempSrc: Oral Oral   Resp: 16 16   SpO2: 100% 100% 100%     Darrelyn Morro, PA-C 09/03/14 2341   PT LEFT WITHOUT WAITING ON HER DISCHARGE INSTRUCTIONS AND PRESCRIPTIONS.    Jaynie Crumble, PA-C 09/03/14 2341  Jerelyn Scott, MD 09/03/14 [phone]

## 2014-09-03 NOTE — ED Notes (Signed)
Pt states that she has left hand pain and numbness for 1 month and was seen for the same in the past. Denies any injury.

## 2014-09-03 NOTE — ED Notes (Signed)
Pt left prior to receiving d/c paperwork and scripts. Pt did not vocalize any complaints about waiting prior to leaving. EDP notified and scripts shredded.

## 2014-09-03 NOTE — Discharge Instructions (Signed)
Prednisone as prescribed until all gone. norco for severe pain. Ice. Elevate. Follow up with your doctor and with your neurologist.   Shoulder Pain The shoulder is the joint that connects your arms to your body. The bones that form the shoulder joint include the upper arm bone (humerus), the shoulder blade (scapula), and the collarbone (clavicle). The top of the humerus is shaped like a ball and fits into a rather flat socket on the scapula (glenoid cavity). A combination of muscles and strong, fibrous tissues that connect muscles to bones (tendons) support your shoulder joint and hold the ball in the socket. Small, fluid-filled sacs (bursae) are located in different areas of the joint. They act as cushions between the bones and the overlying soft tissues and help reduce friction between the gliding tendons and the bone as you move your arm. Your shoulder joint allows a wide range of motion in your arm. This range of motion allows you to do things like scratch your back or throw a ball. However, this range of motion also makes your shoulder more prone to pain from overuse and injury. Causes of shoulder pain can originate from both injury and overuse and usually can be grouped in the following four categories:  Redness, swelling, and pain (inflammation) of the tendon (tendinitis) or the bursae (bursitis).  Instability, such as a dislocation of the joint.  Inflammation of the joint (arthritis).  Broken bone (fracture). HOME CARE INSTRUCTIONS   Apply ice to the sore area.  Put ice in a plastic bag.  Place a towel between your skin and the bag.  Leave the ice on for 15-20 minutes, 3-4 times per day for the first 2 days, or as directed by your health care provider.  Stop using cold packs if they do not help with the pain.  If you have a shoulder sling or immobilizer, wear it as long as your caregiver instructs. Only remove it to shower or bathe. Move your arm as little as possible, but keep your  hand moving to prevent swelling.  Squeeze a soft ball or foam pad as much as possible to help prevent swelling.  Only take over-the-counter or prescription medicines for pain, discomfort, or fever as directed by your caregiver. SEEK MEDICAL CARE IF:   Your shoulder pain increases, or new pain develops in your arm, hand, or fingers.  Your hand or fingers become cold and numb.  Your pain is not relieved with medicines. SEEK IMMEDIATE MEDICAL CARE IF:   Your arm, hand, or fingers are numb or tingling.  Your arm, hand, or fingers are significantly swollen or turn white or blue. MAKE SURE YOU:   Understand these instructions.  Will watch your condition.  Will get help right away if you are not doing well or get worse. Document Released: 03/17/2005 Document Revised: 10/22/2013 Document Reviewed: 05/22/2011 [facility] Patient Information 2015 [place]Care, [place]. This information is not intended to replace advice given to you by your health care provider. Make sure you discuss any questions you have with your health care provider.

## 2014-09-04 ENCOUNTER — Telehealth (HOSPITAL_BASED_OUTPATIENT_CLINIC_OR_DEPARTMENT_OTHER): Payer: Self-pay | Admitting: Emergency Medicine

## 2014-09-18 ENCOUNTER — Telehealth: Payer: Self-pay | Admitting: General Practice

## 2014-09-18 ENCOUNTER — Ambulatory Visit: Payer: Medicaid Other | Admitting: Family Medicine

## 2014-09-18 NOTE — Telephone Encounter (Signed)
Patient walked in today saying she had an appointment scheduled for today @ 12:30 for a new patient; patient was asked to present the appointment card given to her when she made the appointment; patient was unable to present card and was made a future appointment on the 4/11 @ 11:00am as well as my direct line;

## 2014-09-30 ENCOUNTER — Encounter: Payer: Self-pay | Admitting: Family Medicine

## 2014-09-30 ENCOUNTER — Ambulatory Visit: Payer: Medicaid Other | Attending: Family Medicine | Admitting: Family Medicine

## 2014-09-30 VITALS — BP 121/85 | HR 68 | Temp 98.5°F | Resp 16 | Ht 64.0 in | Wt 235.0 lb

## 2014-09-30 DIAGNOSIS — M545 Low back pain, unspecified: Secondary | ICD-10-CM

## 2014-09-30 DIAGNOSIS — R21 Rash and other nonspecific skin eruption: Secondary | ICD-10-CM | POA: Diagnosis not present

## 2014-09-30 DIAGNOSIS — L304 Erythema intertrigo: Secondary | ICD-10-CM

## 2014-09-30 DIAGNOSIS — R350 Frequency of micturition: Secondary | ICD-10-CM

## 2014-09-30 DIAGNOSIS — I10 Essential (primary) hypertension: Secondary | ICD-10-CM | POA: Insufficient documentation

## 2014-09-30 DIAGNOSIS — Z114 Encounter for screening for human immunodeficiency virus [HIV]: Secondary | ICD-10-CM | POA: Insufficient documentation

## 2014-09-30 DIAGNOSIS — B351 Tinea unguium: Secondary | ICD-10-CM

## 2014-09-30 DIAGNOSIS — B353 Tinea pedis: Secondary | ICD-10-CM

## 2014-09-30 LAB — GLUCOSE, POCT (MANUAL RESULT ENTRY): POC GLUCOSE: 75 mg/dL (ref 70–99)

## 2014-09-30 LAB — POCT URINALYSIS DIPSTICK
BILIRUBIN UA: NEGATIVE
GLUCOSE UA: NEGATIVE
Ketones, UA: NEGATIVE
NITRITE UA: NEGATIVE
PH UA: 7.5
Protein, UA: NEGATIVE
Spec Grav, UA: 1.02
Urobilinogen, UA: 0.2

## 2014-09-30 LAB — POCT GLYCOSYLATED HEMOGLOBIN (HGB A1C): Hemoglobin A1C: 5

## 2014-09-30 MED ORDER — MELOXICAM 15 MG PO TABS: 15.0000 mg | ORAL_TABLET | Freq: Every day | ORAL | Status: DC

## 2014-09-30 MED ORDER — TERBINAFINE HCL 250 MG PO TABS: 250.0000 mg | ORAL_TABLET | Freq: Every day | ORAL | Status: DC

## 2014-09-30 MED ORDER — AMLODIPINE BESYLATE 5 MG PO TABS: 5.0000 mg | ORAL_TABLET | Freq: Every day | ORAL | Status: DC

## 2014-09-30 NOTE — Progress Notes (Signed)
   Subjective:    Patient ID: Jacqueline Juarez, female    DOB: Jan 07, 1981, 34 y.o.   MRN: [id number] CC: leg pain, HTN  HPI  1. CHRONIC HYPERTENSION  Disease Monitoring  Blood pressure range: SBP 144-128  Chest pain: no   Dyspnea: no   Claudication: no   Medication compliance: yes with HCTZ  Medication Side Effects  Lightheadedness: no   Urinary frequency: yes   Edema: no   Impotence: no   2. Rash: on feet and under breast. Itchy. Has been presents for a few weeks. No treatment.  3. Low back pain : this is chronic. Worse with activity. No fever. Chills. Urinary frequency but no incontinence.   Soc Hx: non smoker Med Hx: HTN Surg Hx: negative  Review of Systems  Constitutional: Negative for fever and chills.  Musculoskeletal: Positive for back pain.        Objective:   Physical Exam BP 121/85 mmHg  Pulse 68  Temp(Src) 98.5 F (36.9 C) (Oral)  Resp 16  Ht 5\' 4"  (1.626 m)  Wt 235 lb (106.595 kg)  BMI 40.32 kg/m2  SpO2 100%  LMP 09/21/2014  Wt Readings from Last 3 Encounters:  09/30/14 235 lb (106.595 kg)  07/19/14 230 lb (104.327 kg)  07/19/14 230 lb (104.327 kg)  General appearance: alert, cooperative and no distress Lungs: clear to auscultation bilaterally Heart: regular rate and rhythm, S1, S2 normal, no murmur, click, rub or gallop  Back Exam: Back: Normal Curvature, no deformities or CVA tenderness  Paraspinal Tenderness: low back   LE Strength 5/5  LE Sensation: in tact  LE Reflexes 2+ and symmetric  Straight leg raise: negative  Extremities: extremities normal, atraumatic, no cyanosis or edema Pulses: 2+ and symmetric  Lab Results  Component Value Date   HGBA1C 5.0 09/30/2014        Assessment & Plan:

## 2014-09-30 NOTE — Progress Notes (Signed)
Establish Care HFU leg pain  F/U HTN  Complaining of frequency urination  Rash on legs and breast

## 2014-09-30 NOTE — Patient Instructions (Addendum)
[name],  Thank you for coming in today. It was a pleasure meeting you. I look forward to being your primary doctor.  1. Low back pain: due to herniated disc Pain control Flexeril nightly mobic during the day with food  Exercises  2. Foot rash on skin and nails- this is a fungus 3 month course of lamisil   3. HTN:  Stop HCTZ this is causing urination Start Norvasc 5 mg daily  4. Breast rash: this is fungal. Prednisone can bring on a fungal rash.  Keep skin cool and dry.  Take lamisil   F/u in 3 months for HTN and back pain   Dr. Armen Juarez    Back Exercises Back exercises help treat and prevent back injuries. The goal of back exercises is to increase the strength of your abdominal and back muscles and the flexibility of your back. These exercises should be started when you no longer have back pain. Back exercises include:  Pelvic Tilt. Lie on your back with your knees bent. Tilt your pelvis until the lower part of your back is against the floor. Hold this position 5 to 10 sec and repeat 5 to 10 times.  Knee to Chest. Pull first 1 knee up against your chest and hold for 20 to 30 seconds, repeat this with the other knee, and then both knees. This may be done with the other leg straight or bent, whichever feels better.  Sit-Ups or Curl-Ups. Bend your knees 90 degrees. Start with tilting your pelvis, and do a partial, slow sit-up, lifting your trunk only 30 to 45 degrees off the floor. Take at least 2 to 3 seconds for each sit-up. Do not do sit-ups with your knees out straight. If partial sit-ups are difficult, simply do the above but with only tightening your abdominal muscles and holding it as directed.  Hip-Lift. Lie on your back with your knees flexed 90 degrees. Push down with your feet and shoulders as you raise your hips a couple inches off the floor; hold for 10 seconds, repeat 5 to 10 times.  Back arches. Lie on your stomach, propping yourself up on bent elbows. Slowly  press on your hands, causing an arch in your low back. Repeat 3 to 5 times. Any initial stiffness and discomfort should lessen with repetition over time.  Shoulder-Lifts. Lie face down with arms beside your body. Keep hips and torso pressed to floor as you slowly lift your head and shoulders off the floor. Do not overdo your exercises, especially in the beginning. Exercises may cause you some mild back discomfort which lasts for a few minutes; however, if the pain is more severe, or lasts for more than 15 minutes, do not continue exercises until you see your caregiver. Improvement with exercise therapy for back problems is slow.  See your caregivers for assistance with developing a proper back exercise program. Document Released: 07/15/2004 Document Revised: 08/30/2011 Document Reviewed: 04/08/2011 [facility]Exit[facility] Patient Information 2015 [place]Care, [place]. This information is not intended to replace advice given to you by your health care provider. Make sure you discuss any questions you have with your health care provider.

## 2014-10-01 LAB — HIV ANTIBODY (ROUTINE TESTING W REFLEX): HIV: NONREACTIVE

## 2014-10-01 NOTE — Assessment & Plan Note (Signed)
1. Low back pain: due to herniated disc Pain control Flexeril nightly mobic during the day with food  Exercises

## 2014-10-01 NOTE — Assessment & Plan Note (Signed)
Screening HIV done today  

## 2014-10-01 NOTE — Assessment & Plan Note (Signed)
   Foot rash on skin and nails- this is a fungus 3 month course of lamisil

## 2014-10-01 NOTE — Assessment & Plan Note (Addendum)
HTN:  Stop HCTZ this is causing urination Start Norvasc 5 mg daily

## 2014-10-01 NOTE — Assessment & Plan Note (Addendum)
Breast rash: this is fungal. Prednisone can bring on a fungal rash.  Keep skin cool and dry.  Take lamisil

## 2014-10-01 NOTE — Assessment & Plan Note (Signed)
   Foot rash on skin and nails- this is a fungus 3 month course of lamisil 

## 2014-10-09 ENCOUNTER — Telehealth: Payer: Self-pay | Admitting: *Deleted

## 2014-10-09 NOTE — Telephone Encounter (Signed)
-----   Message from Josalyn Funches, MD sent at 10/01/2014  8:50 AM EDT ----- Screening HIV neg 

## 2014-10-09 NOTE — Telephone Encounter (Signed)
LVM to return call.

## 2014-11-25 DIAGNOSIS — Z0289 Encounter for other administrative examinations: Secondary | ICD-10-CM

## 2014-12-03 ENCOUNTER — Other Ambulatory Visit: Payer: Self-pay | Admitting: Family Medicine

## 2014-12-25 ENCOUNTER — Ambulatory Visit: Payer: Medicaid Other | Admitting: Family Medicine

## 2015-01-21 ENCOUNTER — Ambulatory Visit: Payer: Medicaid Other | Admitting: Family Medicine

## 2015-01-29 ENCOUNTER — Ambulatory Visit: Payer: Medicaid Other | Attending: Family Medicine | Admitting: Family Medicine

## 2015-01-29 ENCOUNTER — Ambulatory Visit: Payer: Medicaid Other | Admitting: Adult Health

## 2015-01-29 ENCOUNTER — Encounter: Payer: Self-pay | Admitting: Family Medicine

## 2015-01-29 ENCOUNTER — Telehealth: Payer: Self-pay

## 2015-01-29 DIAGNOSIS — Z23 Encounter for immunization: Secondary | ICD-10-CM

## 2015-01-29 DIAGNOSIS — M545 Low back pain, unspecified: Secondary | ICD-10-CM

## 2015-01-29 LAB — BASIC METABOLIC PANEL
BUN: 8 mg/dL (ref 7–25)
CALCIUM: 9 mg/dL (ref 8.6–10.2)
CO2: 25 mmol/L (ref 20–31)
Chloride: 106 mmol/L (ref 98–110)
Creat: 0.53 mg/dL (ref 0.50–1.10)
GLUCOSE: 83 mg/dL (ref 65–99)
Potassium: 3.5 mmol/L (ref 3.5–5.3)
Sodium: 140 mmol/L (ref 135–146)

## 2015-01-29 MED ORDER — GABAPENTIN 300 MG PO CAPS: 300.0000 mg | ORAL_CAPSULE | Freq: Every day | ORAL | Status: DC

## 2015-01-29 MED ORDER — NABUMETONE 500 MG PO TABS: 500.0000 mg | ORAL_TABLET | Freq: Two times a day (BID) | ORAL | Status: DC | PRN

## 2015-01-29 NOTE — Telephone Encounter (Signed)
Patient NO- showed her apt. With [employer].

## 2015-01-29 NOTE — Progress Notes (Signed)
   Subjective:    Patient ID: Jacqueline Juarez, female    DOB: 1980-08-09, 34 y.o.   MRN: [id number] CC: f/u low back pain  HPI  34 yo F with lumbar back pain known herniated disk presents for f/u visit:  1. Back pain: persistent. Worse at night. Numbness in L leg.  No leg weakness. No fecal or urinary incontinence. Pain is lumbar and radiates to L groin. Pain has been ongoing for past year. mobic and flexeril haven not helped. She has not had PT.   Social History  Substance Use Topics  . Smoking status: Never Smoker   . Smokeless tobacco: Never Used  . Alcohol Use: Yes     Comment: rare   Review of Systems  Constitutional: Negative for fever and chills.  Eyes: Negative for visual disturbance.  Respiratory: Negative for shortness of breath.   Cardiovascular: Negative for chest pain.  Gastrointestinal: Negative for abdominal pain and blood in stool.  Musculoskeletal: Positive for back pain. Negative for arthralgias and gait problem.  Skin: Negative for rash.  Allergic/Immunologic: Negative for immunocompromised state.  Neurological: Positive for numbness. Negative for weakness.  Hematological: Negative for adenopathy. Does not bruise/bleed easily.  Psychiatric/Behavioral: Positive for sleep disturbance. Negative for suicidal ideas and dysphoric mood.       Objective:   Physical Exam  Constitutional: She is oriented to person, place, and time. She appears well-developed and well-nourished. No distress.  HENT:  Head: Normocephalic and atraumatic.  Pulmonary/Chest: Effort normal.  Musculoskeletal: She exhibits no edema.       Back:  Neurological: She is alert and oriented to person, place, and time.  Skin: Skin is warm and dry. No rash noted.  Psychiatric: She has a normal mood and affect.  BP 114/78 mmHg  Pulse 84  Temp(Src) 100 F (37.8 C) (Oral)  Resp 18  Ht  (1.626 m)  Wt 232 lb (105.235 kg)  BMI 39.80 kg/m2  SpO2 98%  LMP 01/08/2015   Back imaging  reviewed:  Lumbar MRI 06/2014 Abnormal MRI lumbar spine (without) demonstrating 1. At L5-S1: disc bulging and facet hypertrophy; far left lateral disc bulging displaces the exiting left L5 root; also there is mild biforaminal stenosis.  2. Remaining levels are unremarkable    Assessment & Plan:

## 2015-01-29 NOTE — Progress Notes (Signed)
Complaining of back pain no hx injury  Stated leg numbness on leg worsen when sleeping

## 2015-01-29 NOTE — Assessment & Plan Note (Signed)
A: Low back pain with herniated disk, no sciatica. No red flags to suggest acute pathology. Pain uncontrolled with current treatment plan.   P: Start nabumetone instead of ibuprofen, this is anti inflammatory Start gabapentin 300 mg at night  Physical therapy ordered  F/u in 4-6 weeks for back pain

## 2015-01-29 NOTE — Patient Instructions (Signed)
Jacqueline Juarez,  Thank you for coming in today  1. Low back pain with herniated disk Start nabumetone instead of ibuprofen, this is anti inflammatory Start gabapentin 300 mg at night  Physical therapy ordered  F/u in 4-6 weeks for back pain  Dr. Armen Pickup

## 2015-02-06 ENCOUNTER — Ambulatory Visit (INDEPENDENT_AMBULATORY_CARE_PROVIDER_SITE_OTHER): Payer: Medicaid Other | Admitting: Adult Health

## 2015-02-06 ENCOUNTER — Encounter: Payer: Self-pay | Admitting: Adult Health

## 2015-02-06 VITALS — BP 133/91 | HR 73 | Temp 98.9°F | Ht 64.0 in | Wt 216.0 lb

## 2015-02-06 DIAGNOSIS — G8929 Other chronic pain: Secondary | ICD-10-CM

## 2015-02-06 DIAGNOSIS — R202 Paresthesia of skin: Secondary | ICD-10-CM

## 2015-02-06 DIAGNOSIS — R200 Anesthesia of skin: Secondary | ICD-10-CM

## 2015-02-06 DIAGNOSIS — M545 Low back pain, unspecified: Secondary | ICD-10-CM

## 2015-02-06 NOTE — Progress Notes (Signed)
I have reviewed and agreed above plan. 

## 2015-02-06 NOTE — Progress Notes (Signed)
PATIENT: Essence D Broshears DOB: 1981/01/19  REASON FOR VISIT: follow up- numbness in extremities, low back pain HISTORY FROM: patient  HISTORY OF PRESENT ILLNESS: Miss Guyett is a 34 year old female with a history of numbness in the left hand and foot and low back pain. She returns today for follow-up. The patient has had MRI of the lumbar and thoracic spine. Her MRI of the lumbar spine showed a mild herniated disc at L5-S1 level with no significant stenosis. The patient did have some mild sensory changes on EMG study. The patient reports that she is now having numbness in the right hand and right foot. She states that her right leg will become very restless at bedtime. Her primary care provider put her on gabapentin and Relafen. She reports that the gabapentin has offered some benefit. She denies any new neurological symptoms. She returns today for an evaluation.  HISTORY 06/06/14 Terrace Arabia): Carmine D Fairclough is a 34 yo RH female, referred by emergency room, and her primary care physician Dr. Parke Simmers for evaluation of constellation of complaints.  She had a past medical history of depression anxiety, is on disability due to her mood disorder, lives with her teenager children, she does not drive.  She has a history of chronic low back pain, she presented to emergency room in November 20 first 2015, with constellation of complains, numbness in her left hand, left foot, intermittent, worsening low back pain, was evaluated by neuro hospitalist Dr. Hosie Poisson,  MRI of the brain, and cervical spine with without contrast showed no significant abnormality Lab, normal CBC, CMP  Her symptoms has improved some over the past few weeks, but she continue to complain intermittent left hand, bilateral foot numbness, gait difficulty due to low back pain  REVIEW OF SYSTEMS: Out of a complete 14 system review of symptoms, the patient complains only of the following symptoms, and all other reviewed systems are  negative.  Excessive sweating, joint pain, back pain, aching muscles, walking difficulty, neck pain, rash  ALLERGIES: Allergies  Allergen Reactions  . Penicillins Hives    HOME MEDICATIONS: Outpatient Prescriptions Prior to Visit  Medication Sig Dispense Refill  . amLODipine (NORVASC) 5 MG tablet Take 1 tablet (5 mg total) by mouth daily. 30 tablet 5  . gabapentin (NEURONTIN) 300 MG capsule Take 1 capsule (300 mg total) by mouth at bedtime. 30 capsule 3  . Multiple Vitamin (MULTIVITAMIN WITH MINERALS) TABS Take 1 tablet by mouth every morning.    . nabumetone (RELAFEN) 500 MG tablet Take 1 tablet (500 mg total) by mouth 2 (two) times daily as needed for moderate pain. 60 tablet 0   No facility-administered medications prior to visit.    PAST MEDICAL HISTORY: Past Medical History  Diagnosis Date  . Chronic back pain   . Paresthesias     left hand, bilat feet  . High cholesterol Dx 2014  . Hypertension DX 2014  . Lumbar radiculopathy Dx 2015  . Peripheral neuropathy     left hand  . Anxiety     At age 57  . Panic     At age 10  . Arthritis Dx 2014    PAST SURGICAL HISTORY: Past Surgical History  Procedure Laterality Date  . No past surgeries      FAMILY HISTORY: Family History  Problem Relation Age of Onset  . Arthritis Mother   . Arthritis Father     SOCIAL HISTORY: Social History   Social History  . Marital Status: Single  Spouse Name: N/A  . Number of Children: 2  . Years of Education: 9th   Occupational History  .      disabled   Social History Main Topics  . Smoking status: Never Smoker   . Smokeless tobacco: Never Used  . Alcohol Use: Yes     Comment: rare  . Drug Use: No  . Sexual Activity: Not on file   Other Topics Concern  . Not on file   Social History Narrative   Patient is single and lives at home with her children.    Patient is disabled.   Education 9 th grade.   Right handed.   Caffeine sweet tea two bottles daily.       PHYSICAL EXAM  Filed Vitals:   02/06/15 1057  BP: 133/91  Pulse: 73  Height: 5\' 4"  (1.626 m)  Weight: 216 lb (97.977 kg)   Body mass index is 37.06 kg/(m^2).  Generalized: Well developed, in no acute distress   Neurological examination  Mentation: Alert oriented to time, place, history taking. Follows all commands speech and language fluent Cranial nerve II-XII: Pupils were equal round reactive to light. Extraocular movements were full, visual field were full on confrontational test. Facial sensation and strength were normal. Uvula tongue midline. Head turning and shoulder shrug  were normal and symmetric. Motor: The motor testing reveals 5 over 5 strength of all 4 extremities. Good symmetric motor tone is noted throughout.  Sensory: Sensory testing is intact to soft touch on all 4 extremities. No evidence of extinction is noted.  Coordination: Cerebellar testing reveals good finger-nose-finger and heel-to-shin bilaterally.  Gait and station: Gait is mildly unsteady.Romberg is negative. No drift is seen.  Reflexes: Deep tendon reflexes are symmetric and normal bilaterally.   DIAGNOSTIC DATA (LABS, IMAGING, TESTING) - I reviewed patient records, labs, notes, testing and imaging myself where available.      Component Value Date/Time   NA 140 01/29/2015 1017   K 3.5 01/29/2015 1017   CL 106 01/29/2015 1017   CO2 25 01/29/2015 1017   GLUCOSE 83 01/29/2015 1017   BUN 8 01/29/2015 1017   CREATININE 0.53 01/29/2015 1017   CREATININE 0.67 05/11/2014 1935   CALCIUM 9.0 01/29/2015 1017   PROT 6.9 05/11/2014 1935   ALBUMIN 3.4* 05/11/2014 1935   AST 15 05/11/2014 1935   ALT 15 05/11/2014 1935   ALKPHOS 61 05/11/2014 1935   BILITOT 0.9 05/11/2014 1935   GFRNONAA >90 05/11/2014 1935   GFRAA >90 05/11/2014 1935      ASSESSMENT AND PLAN 34 y.o. year old female  has a past medical history of Chronic back pain; Paresthesias; High cholesterol (Dx 2014); Hypertension (DX  2014); Lumbar radiculopathy (Dx 2015); Peripheral neuropathy; Anxiety; Panic; and Arthritis (Dx 2014). here with:  1. Numbness in extremities 2. Low back pain  I consulted with Dr. Terrace Arabia at this time we have completed a full workup for this patient. There are no further interventions at this time. The patient has tried physical therapy in the past with no benefit. The patient was instructed to continue follow-up with her primary care provider. She should continue the gabapentin. She will follow up with our office on an as-needed basis.   Butch Penny, MSN, NP-C 02/06/2015, 11:07 AM [facility] [address] [place], [place] [address] [phone]  Note: This document was prepared with digital dictation and possible smart phrase technology. Any transcriptional errors that result from this process are unintentional.

## 2015-02-06 NOTE — Patient Instructions (Signed)
Our work-up does not indicate any futher interventions at this time. Please continue to follow-up with your PCP.  If your symptoms worsen or you develop new symptoms please let us know.

## 2015-02-26 ENCOUNTER — Telehealth: Payer: Self-pay | Admitting: *Deleted

## 2015-02-26 NOTE — Telephone Encounter (Signed)
-----   Message from Dessa Phi, MD sent at 01/30/2015  9:19 AM EDT ----- Normal bmp

## 2015-02-26 NOTE — Telephone Encounter (Signed)
LVM to return call.

## 2015-03-28 ENCOUNTER — Ambulatory Visit: Payer: Medicaid Other | Attending: Family Medicine | Admitting: Family Medicine

## 2015-03-28 ENCOUNTER — Encounter: Payer: Self-pay | Admitting: Family Medicine

## 2015-03-28 VITALS — BP 133/84 | HR 70 | Temp 99.1°F | Resp 16 | Ht 64.0 in | Wt 221.0 lb

## 2015-03-28 DIAGNOSIS — M545 Low back pain, unspecified: Secondary | ICD-10-CM

## 2015-03-28 DIAGNOSIS — G8929 Other chronic pain: Secondary | ICD-10-CM | POA: Insufficient documentation

## 2015-03-28 DIAGNOSIS — E876 Hypokalemia: Secondary | ICD-10-CM

## 2015-03-28 DIAGNOSIS — I10 Essential (primary) hypertension: Secondary | ICD-10-CM | POA: Diagnosis not present

## 2015-03-28 DIAGNOSIS — Z79899 Other long term (current) drug therapy: Secondary | ICD-10-CM | POA: Diagnosis not present

## 2015-03-28 DIAGNOSIS — R200 Anesthesia of skin: Secondary | ICD-10-CM | POA: Insufficient documentation

## 2015-03-28 DIAGNOSIS — Z0000 Encounter for general adult medical examination without abnormal findings: Secondary | ICD-10-CM

## 2015-03-28 LAB — BASIC METABOLIC PANEL
BUN: 7 mg/dL (ref 7–25)
CO2: 23 mmol/L (ref 20–31)
Calcium: 9 mg/dL (ref 8.6–10.2)
Chloride: 106 mmol/L (ref 98–110)
Creat: 0.59 mg/dL (ref 0.50–1.10)
Glucose, Bld: 89 mg/dL (ref 65–99)
POTASSIUM: 3 mmol/L — AB (ref 3.5–5.3)
SODIUM: 141 mmol/L (ref 135–146)

## 2015-03-28 MED ORDER — CYCLOBENZAPRINE HCL 10 MG PO TABS: 10.0000 mg | ORAL_TABLET | Freq: Every day | ORAL | Status: DC

## 2015-03-28 MED ORDER — AMLODIPINE BESYLATE 5 MG PO TABS: 5.0000 mg | ORAL_TABLET | Freq: Every day | ORAL | Status: DC

## 2015-03-28 MED ORDER — PREGABALIN 50 MG PO CAPS: 50.0000 mg | ORAL_CAPSULE | Freq: Three times a day (TID) | ORAL | Status: DC

## 2015-03-28 NOTE — Progress Notes (Signed)
F/U chronic back pain  Pain scale # 9 Requesting Potassium check  No hx tobacco

## 2015-03-28 NOTE — Progress Notes (Signed)
Subjective:    Patient ID: Jacqueline Juarez, female    DOB: Sep 20, 1980, 34 y.o.   MRN: [id number] CC: f/u low back pain  Back Pain Pertinent negatives include no abdominal pain, chest pain, fever, headaches, numbness or weakness.    34 yo F with lumbar back pain known herniated disk presents for f/u visit:  1. Chronic low back pain: persistent. Worse at night. Numbness in L leg.  No leg weakness. No fecal or urinary incontinence. Pain is lumbar and radiates to L groin. Pain has been ongoing for many years. Patient reports oxycodone, NSAID, gabapentin and PT have not helped with pain. At last visit she stated that flexeril also did not help, however she is willing to try it again. She has been out of gabapentin for 1 week w/o change in pain symptoms.   2. HTN: taking norvasc. No CP, SOB or leg swelling. Neck pain. No HA.    Social History  Substance Use Topics  . Smoking status: Never Smoker   . Smokeless tobacco: Never Used  . Alcohol Use: Yes     Comment: rare   Current Outpatient Prescriptions on File Prior to Visit  Medication Sig Dispense Refill  . amLODipine (NORVASC) 5 MG tablet Take 1 tablet (5 mg total) by mouth daily. 30 tablet 5  . Multiple Vitamin (MULTIVITAMIN WITH MINERALS) TABS Take 1 tablet by mouth every morning.     No current facility-administered medications on file prior to visit.   Review of Systems  Constitutional: Negative for fever and chills.  Eyes: Negative for visual disturbance.  Respiratory: Negative for shortness of breath.   Cardiovascular: Negative for chest pain.  Gastrointestinal: Negative for abdominal pain and blood in stool.  Musculoskeletal: Positive for back pain, gait problem and neck pain. Negative for arthralgias.  Skin: Negative for rash.  Allergic/Immunologic: Negative for immunocompromised state.  Neurological: Negative for weakness, numbness and headaches.       Tingling in R hand   Hematological: Negative for adenopathy. Does  not bruise/bleed easily.  Psychiatric/Behavioral: Positive for sleep disturbance. Negative for suicidal ideas and dysphoric mood.       Objective:   Physical Exam  Constitutional: She is oriented to person, place, and time. She appears well-developed and well-nourished. No distress.  HENT:  Head: Normocephalic and atraumatic.  Pulmonary/Chest: Effort normal.  Musculoskeletal: She exhibits no edema.       Back:  Neurological: She is alert and oriented to person, place, and time.  Skin: Skin is warm and dry. No rash noted.  Psychiatric: She has a normal mood and affect.  BP 133/84 mmHg  Pulse 70  Temp(Src) 99.1 F (37.3 C) (Oral)  Resp 16  Ht  (1.626 m)  Wt 221 lb (100.245 kg)  BMI 37.92 kg/m2  SpO2 100%  LMP 03/24/2015   Back imaging reviewed:  Lumbar MRI 06/2014 Abnormal MRI lumbar spine (without) demonstrating 1. At L5-S1: disc bulging and facet hypertrophy; far left lateral disc bulging displaces the exiting left L5 root; also there is mild biforaminal stenosis.  2. Remaining levels are unremarkable    Assessment & Plan:   Florena was seen today for back pain.  Diagnoses and all orders for this visit:  Healthcare maintenance -     Flu Vaccine QUAD 36+ mos IM  Essential hypertension -     Basic Metabolic Panel  Midline low back pain without sciatica -     pregabalin (LYRICA) 50 MG capsule; Take 1 capsule (50  mg total) by mouth 3 (three) times daily. -     cyclobenzaprine (FLEXERIL) 10 MG tablet; Take 1 tablet (10 mg total) by mouth at bedtime.

## 2015-03-28 NOTE — Patient Instructions (Addendum)
[name] was seen today for back pain.  Diagnoses and all orders for this visit:  Healthcare maintenance -     Flu Vaccine QUAD 36+ mos IM  Essential hypertension -     Basic Metabolic Panel  Midline low back pain without sciatica -     pregabalin (LYRICA) 50 MG capsule; Take 1 capsule (50 mg total) by mouth 3 (three) times daily. -     cyclobenzaprine (FLEXERIL) 10 MG tablet; Take 1 tablet (10 mg total) by mouth at bedtime.   F/u in 3 months for chronic low back pain and HTN  Dr. Armen Pickup

## 2015-03-31 ENCOUNTER — Telehealth: Payer: Self-pay | Admitting: Family Medicine

## 2015-03-31 ENCOUNTER — Telehealth: Payer: Self-pay

## 2015-03-31 DIAGNOSIS — E876 Hypokalemia: Secondary | ICD-10-CM | POA: Insufficient documentation

## 2015-03-31 MED ORDER — POTASSIUM CHLORIDE ER 20 MEQ PO TBCR: 40.0000 meq | EXTENDED_RELEASE_TABLET | Freq: Every day | ORAL | Status: DC

## 2015-03-31 NOTE — Telephone Encounter (Signed)
Patient called stating that her insurance does not cover Lyrica. She would like it changed to a different medication. Please f/u

## 2015-03-31 NOTE — Telephone Encounter (Signed)
See not below. 

## 2015-03-31 NOTE — Telephone Encounter (Signed)
-----   Message from Heather A Woods, RN sent at 03/31/2015  9:58 AM EDT -----   ----- Message -----    From: Josalyn Funches, MD    Sent: 03/31/2015   8:49 AM      To: Stella M Giraldez, RMA  Potassium is low at 3.0, goal is 3.5 or higher Supplemental potassium ordered   

## 2015-03-31 NOTE — Addendum Note (Signed)
Addended by: Dessa Phi on: 03/31/2015 08:51 AM   Modules accepted: Orders

## 2015-04-09 ENCOUNTER — Telehealth: Payer: Self-pay | Admitting: Family Medicine

## 2015-04-09 DIAGNOSIS — M545 Low back pain, unspecified: Secondary | ICD-10-CM

## 2015-04-09 NOTE — Telephone Encounter (Signed)
Patient called and requested a medication change for cyclobenzaprine (FLEXERIL) 10 MG tablet . Patient stated that her insurance won't pay for it. Please f/u

## 2015-04-17 MED ORDER — METHOCARBAMOL 500 MG PO TABS: 500.0000 mg | ORAL_TABLET | Freq: Three times a day (TID) | ORAL | Status: DC | PRN

## 2015-04-17 NOTE — Telephone Encounter (Signed)
Ordered robaxin to replace flexeril

## 2015-04-17 NOTE — Telephone Encounter (Signed)
LVM to return call.

## 2015-04-18 NOTE — Telephone Encounter (Signed)
Patient returned phone call, please f/u °

## 2015-04-22 NOTE — Telephone Encounter (Signed)
Returned Pt call Advised to stop taking Flexeril. Rob[facility]in order and [facility]  Pt verbalized understanding

## 2015-05-26 ENCOUNTER — Telehealth: Payer: Self-pay

## 2015-05-26 NOTE — Telephone Encounter (Signed)
-----   Message from Tandy GawHeather A Woods, RN sent at 03/31/2015  9:58 AM EDT -----   ----- Message -----    From: Dessa PhiJosalyn Funches, MD    Sent: 03/31/2015   8:49 AM      To: Dyann KiefStella M Giraldez, RMA  Potassium is low at 3.0, goal is 3.5 or higher Supplemental potassium ordered

## 2015-05-26 NOTE — Telephone Encounter (Signed)
CMA called patient, patient did not answer. Patient was left a generic message to return my call. Message listed below is the message to rely to patient.    Inform patient if she haven't done so already, she can pick up her rx for the potassium supplement, but they will just have to refill it again at the Pharmacy for her to pick up.    ----- Message from Tandy GawHeather A Woods, RN sent at 03/31/2015 9:58 AM EDT -----  ----- Message -----  From: Dessa PhiJosalyn Funches, MD  Sent: 03/31/2015 8:49 AM  To: Dyann KiefStella M Giraldez, RMA  Potassium is low at 3.0, goal is 3.5 or higher  Supplemental potassium ordered

## 2015-05-26 NOTE — Telephone Encounter (Signed)
Patient returned CMA call. Patient was given lab results and was told to take the potassium pills rx'd by Dr. Armen PickupFunches. I encourged patient to contact her pharmacy to have them to refill that medication since it was never pickup. Patient verbalized that she understood with no further questions or concerns.

## 2015-07-07 IMAGING — CR DG CHEST 2V
2 series · 2 of 2 positions shown · non-contrast
Comparison: 07/31/2009

CLINICAL DATA: Short of breath today. Left hand pain and numbness
for 1 month.

EXAM:
CHEST  2 VIEW

[w chest pa]
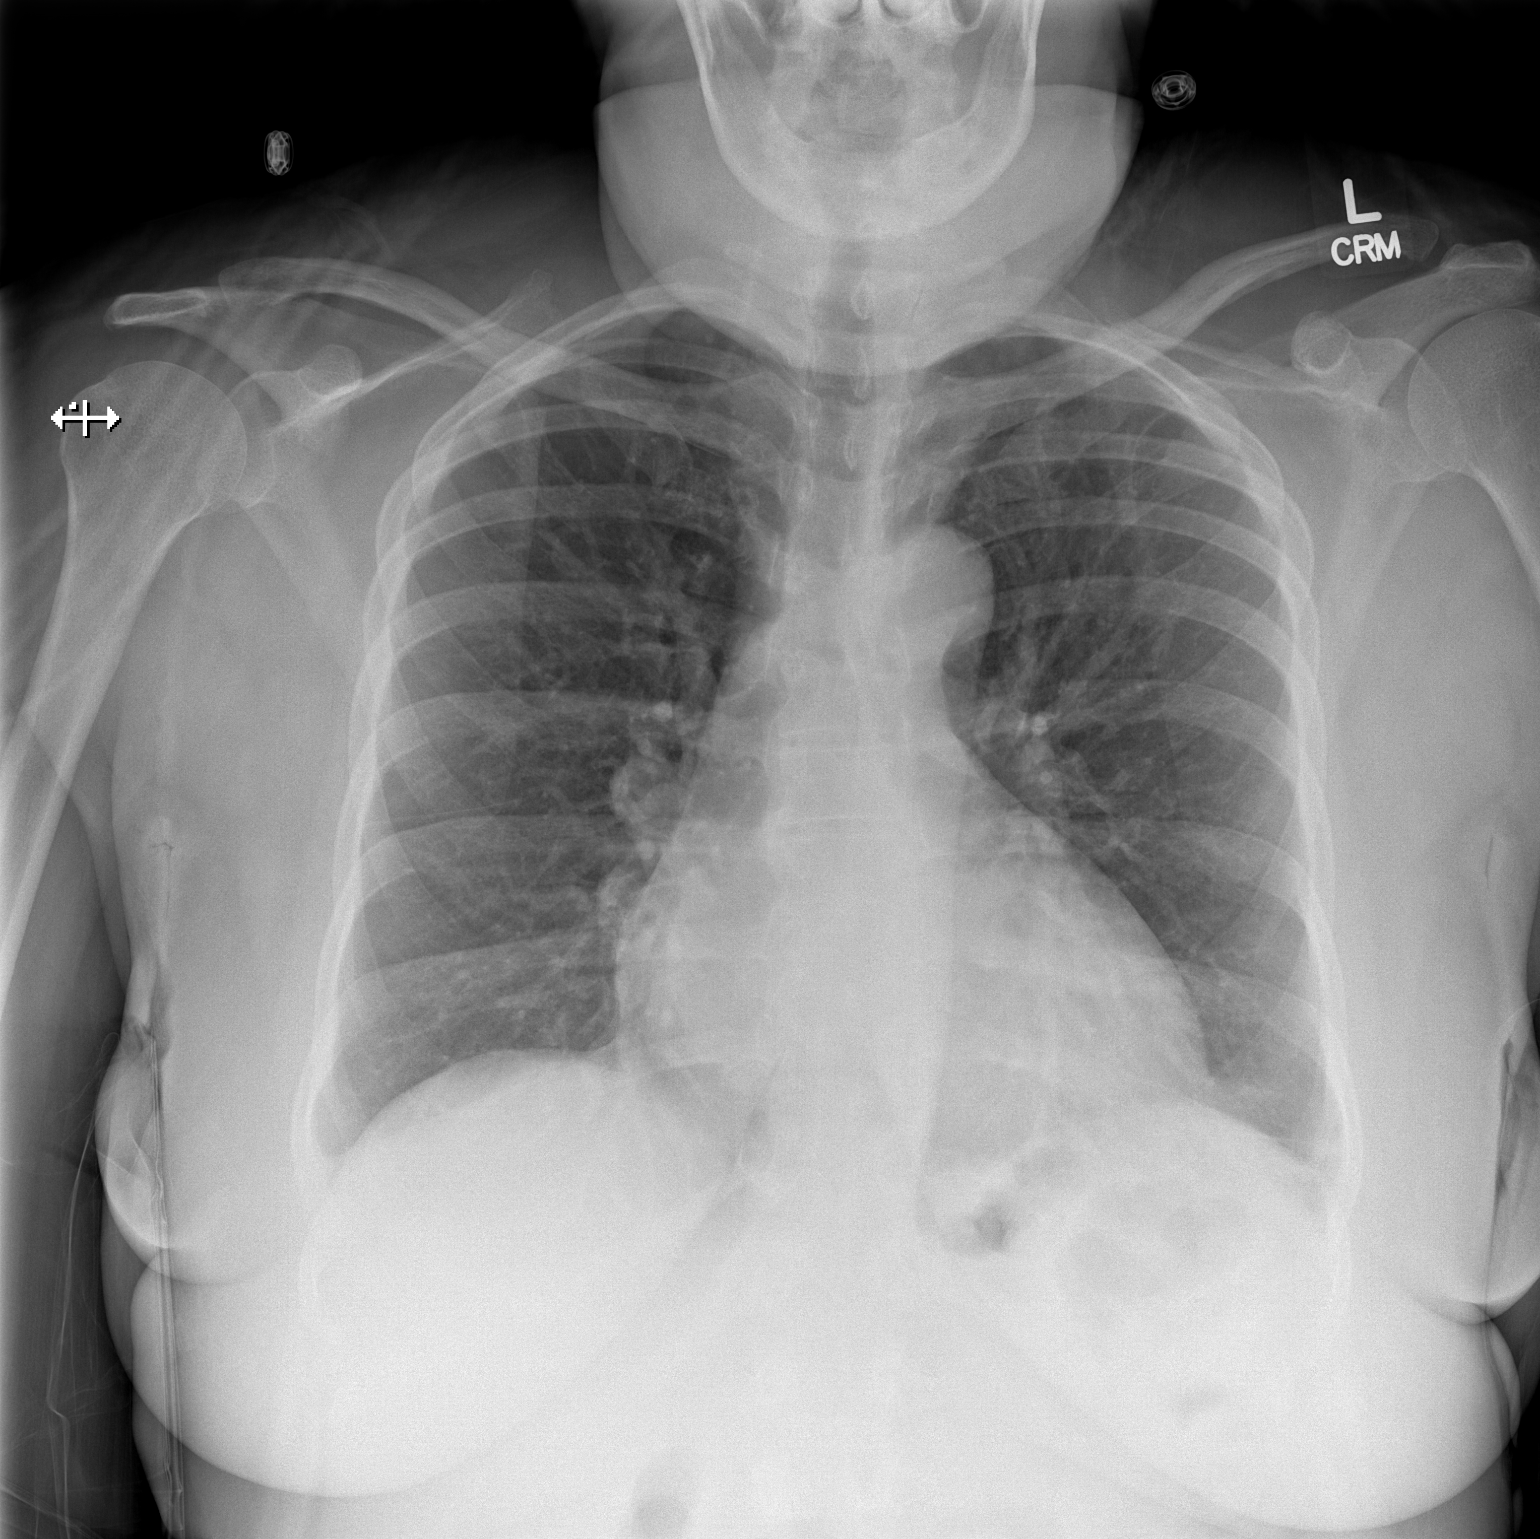

[w chest lat]
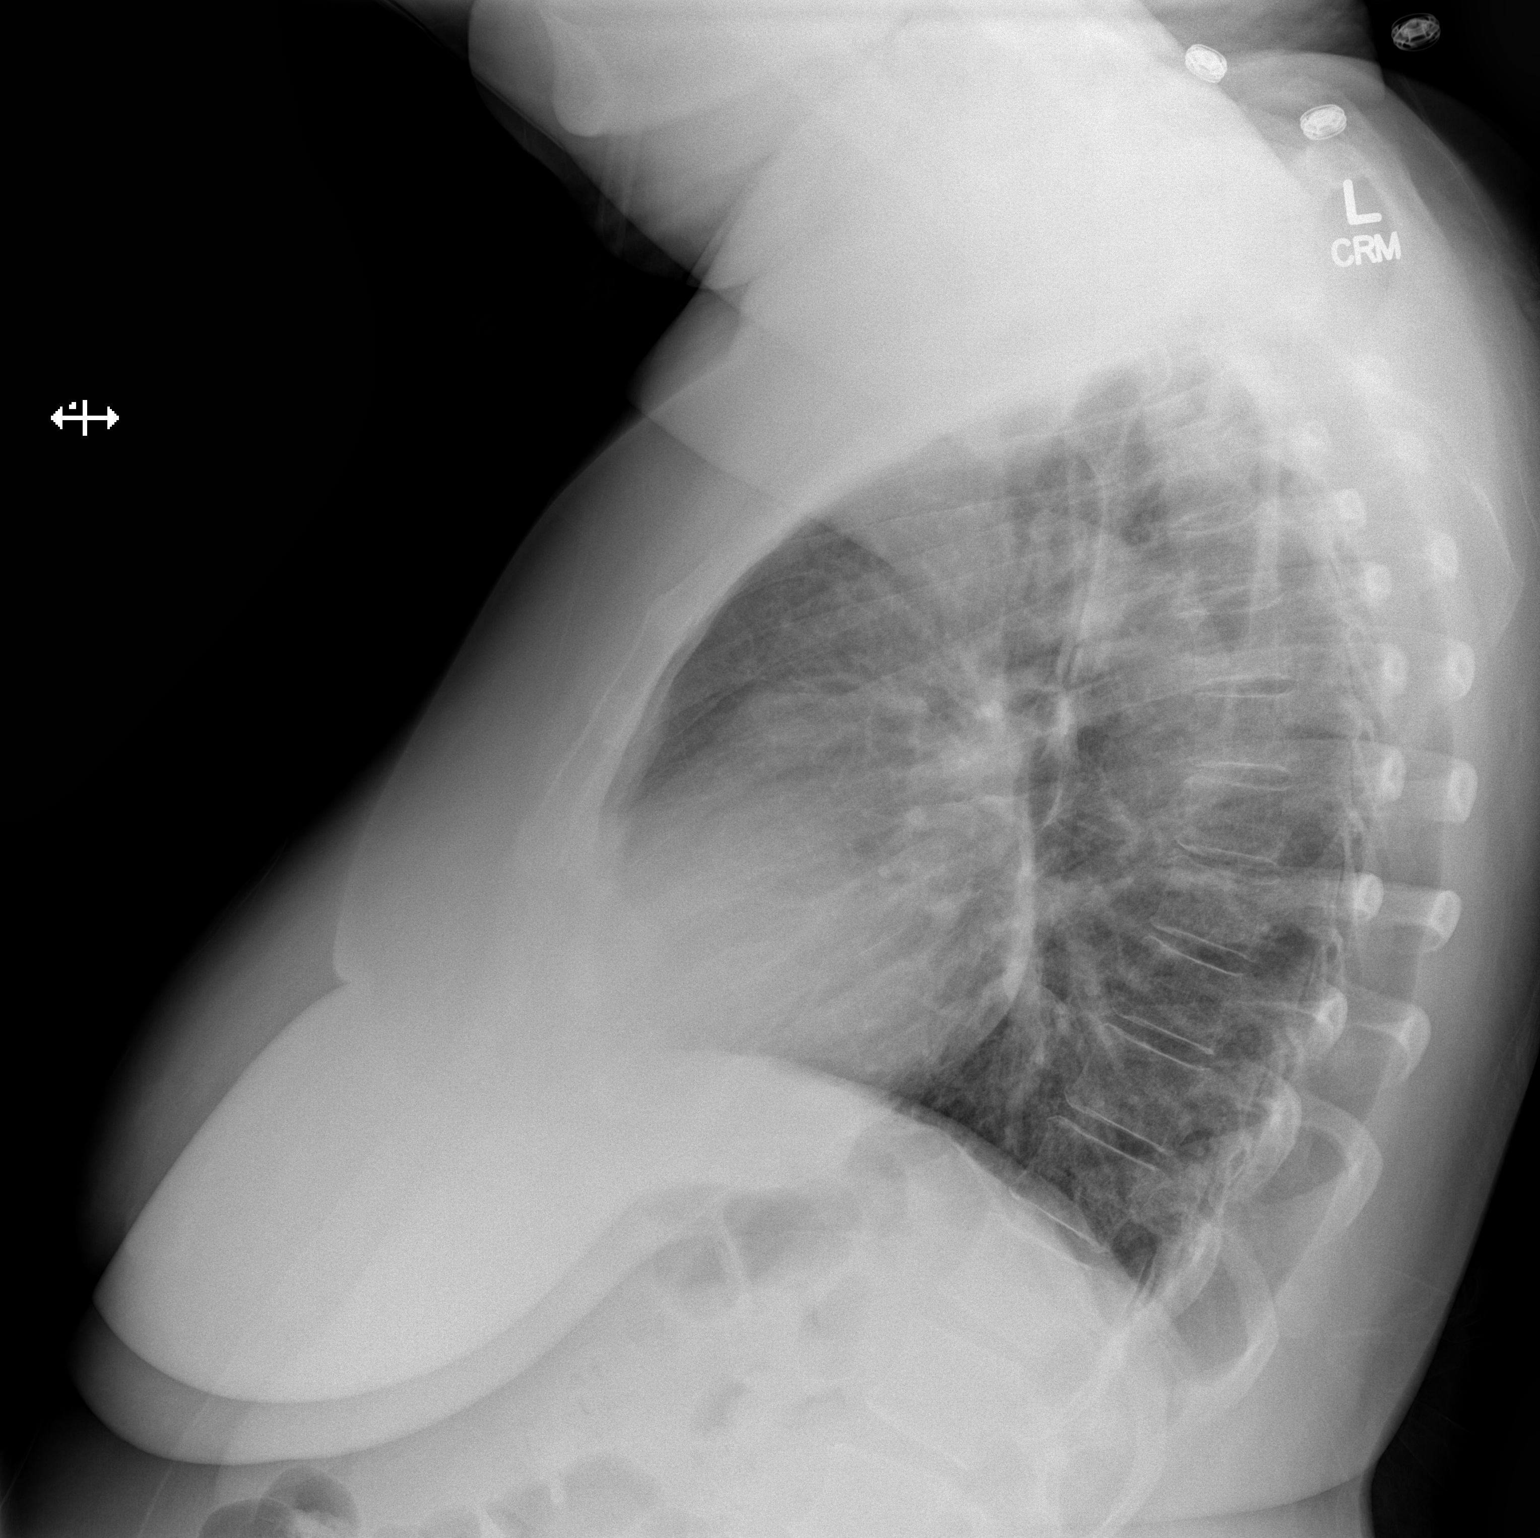

[2 of 2 positions shown; findings below may reference images not displayed]

FINDINGS: Mild cardiomegaly. Normal vascularity. Clear lungs. Very small
pleural effusions and basilar atelectasis.
IMPRESSION: Small pleural effusions and mild basilar atelectasis

Cardiomegaly without decompensation.

## 2015-07-10 ENCOUNTER — Ambulatory Visit: Payer: Medicaid Other | Attending: Family Medicine | Admitting: Family Medicine

## 2015-07-10 ENCOUNTER — Encounter: Payer: Self-pay | Admitting: Family Medicine

## 2015-07-10 VITALS — BP 127/83 | HR 78 | Temp 98.8°F | Resp 16 | Ht 64.0 in | Wt 227.0 lb

## 2015-07-10 DIAGNOSIS — K5901 Slow transit constipation: Secondary | ICD-10-CM | POA: Diagnosis not present

## 2015-07-10 DIAGNOSIS — Z01419 Encounter for gynecological examination (general) (routine) without abnormal findings: Secondary | ICD-10-CM | POA: Insufficient documentation

## 2015-07-10 DIAGNOSIS — I10 Essential (primary) hypertension: Secondary | ICD-10-CM | POA: Diagnosis not present

## 2015-07-10 DIAGNOSIS — Z79899 Other long term (current) drug therapy: Secondary | ICD-10-CM | POA: Diagnosis not present

## 2015-07-10 DIAGNOSIS — Z880 Allergy status to penicillin: Secondary | ICD-10-CM | POA: Diagnosis not present

## 2015-07-10 DIAGNOSIS — K5900 Constipation, unspecified: Secondary | ICD-10-CM | POA: Insufficient documentation

## 2015-07-10 DIAGNOSIS — Z0000 Encounter for general adult medical examination without abnormal findings: Secondary | ICD-10-CM | POA: Insufficient documentation

## 2015-07-10 DIAGNOSIS — E876 Hypokalemia: Secondary | ICD-10-CM | POA: Diagnosis not present

## 2015-07-10 LAB — MAGNESIUM: Magnesium: 1.9 mg/dL (ref 1.5–2.5)

## 2015-07-10 LAB — BASIC METABOLIC PANEL
BUN: 7 mg/dL (ref 7–25)
CHLORIDE: 102 mmol/L (ref 98–110)
CO2: 24 mmol/L (ref 20–31)
CREATININE: 0.6 mg/dL (ref 0.50–1.10)
Calcium: 9.5 mg/dL (ref 8.6–10.2)
GLUCOSE: 98 mg/dL (ref 65–99)
Potassium: 3.7 mmol/L (ref 3.5–5.3)
Sodium: 138 mmol/L (ref 135–146)

## 2015-07-10 MED ORDER — POLYETHYLENE GLYCOL 3350 17 GM/SCOOP PO POWD: 17.0000 g | Freq: Every day | ORAL | Status: DC

## 2015-07-10 MED ORDER — SPIRONOLACTONE 25 MG PO TABS: 25.0000 mg | ORAL_TABLET | Freq: Every day | ORAL | Status: DC

## 2015-07-10 NOTE — Patient Instructions (Addendum)
Diagnoses and all orders for this visit:  Hypokalemia -     Basic Metabolic Panel -     Magnesium -     Discontinue: spironolactone (ALDACTONE) 25 MG tablet; Take 1 tablet (25 mg total) by mouth daily. -     spironolactone (ALDACTONE) 25 MG tablet; Take 1 tablet (25 mg total) by mouth daily.  Essential hypertension -     Discontinue: spironolactone (ALDACTONE) 25 MG tablet; Take 1 tablet (25 mg total) by mouth daily. -     spironolactone (ALDACTONE) 25 MG tablet; Take 1 tablet (25 mg total) by mouth daily.  Slow transit constipation -     polyethylene glycol powder (GLYCOLAX/MIRALAX) powder; Take 17 g by mouth daily.   Due to low potassium and HTN Stop norvasc Replace with spirinolactone 25 mg daily to control BP, prevent low potassium and is can also help with facial hair.   Recommended, pediatrician for your daughters [facility] [address] [place] [place], [place] [address] Get Driving Directions  F/u in 4 weeks for pharmacy BP check and BMP  F/u with me in 6 months   Dr. Armen Pickup

## 2015-07-10 NOTE — Assessment & Plan Note (Signed)
A: BP well controlled Med: compliant P:  Due to low potassium and HTN Stop norvasc Replace with spirinolactone 25 mg daily to control BP, prevent low potassium and is can also help with facial hair.

## 2015-07-10 NOTE — Progress Notes (Signed)
Annual physical and pap smear No vaginal discharge  No pain today  No tobacco user  No suicidal thought in the past two weeks

## 2015-07-10 NOTE — Progress Notes (Signed)
Patient ID: Jacqueline Juarez, female   DOB: 1980-08-09, 35 y.o.   MRN: [id number] SUBJECTIVE:  35 y.o. female for annual routine Pap and checkup. She is not due for pap until next year.   Also HTN: she is complaint with norvasc. She has intermittent leg cramping. She has frequent low potassium. She has facial hair on chin.   Current Outpatient Prescriptions  Medication Sig Dispense Refill  . methocarbamol (ROBAXIN) 500 MG tablet Take 1 tablet (500 mg total) by mouth every 8 (eight) hours as needed for muscle spasms. 60 tablet 2  . Multiple Vitamin (MULTIVITAMIN WITH MINERALS) TABS Take 1 tablet by mouth every morning.    . pregabalin (LYRICA) 50 MG capsule Take 1 capsule (50 mg total) by mouth 3 (three) times daily. 90 capsule 2  . terbinafine (LAMISIL) 250 MG tablet Take 250 mg by mouth daily.    . polyethylene glycol powder (GLYCOLAX/MIRALAX) powder Take 17 g by mouth daily. 3350 g 1  . spironolactone (ALDACTONE) 25 MG tablet Take 1 tablet (25 mg total) by mouth daily. 90 tablet 1   No current facility-administered medications for this visit.   Allergies: Penicillins  Patient's last menstrual period was 07/03/2015.  ROS:  Feeling well. No dyspnea or chest pain on exertion.  No abdominal pain, change in bowel habits, black or bloody stools.  No urinary tract symptoms. GYN ROS: normal menses, no abnormal bleeding, pelvic pain or discharge, no breast pain or new or enlarging lumps on self exam. No neurological complaints.  OBJECTIVE:  The patient appears well, alert, oriented x 3, in no distress. BP 127/83 mmHg  Pulse 78  Temp(Src) 98.8 F (37.1 C) (Oral)  Resp 16  Ht  (1.626 m)  Wt 227 lb (102.967 kg)  BMI 38.95 kg/m2  SpO2 100%  LMP 07/03/2015 ENT normal.  Neck supple. No adenopathy or thyromegaly. PERLA. Lungs are clear, good air entry, no wheezes, rhonchi or rales. S1 and S2 normal, no murmurs, regular rate and rhythm. Abdomen soft without tenderness, guarding, mass or  organomegaly. Extremities show no edema, normal peripheral pulses. Neurological is normal, no focal findings.  BREAST EXAM: breasts appear normal, no suspicious masses, no skin or nipple changes or axillary nodes  PELVIC EXAM: examination not indicated  ASSESSMENT:  well woman  PLAN:  pap smear next year

## 2015-07-11 ENCOUNTER — Telehealth: Payer: Self-pay | Admitting: Family Medicine

## 2015-07-11 NOTE — Telephone Encounter (Signed)
Please call patient  BMP normal with lower end of normal potassium Continue current treatment plan

## 2015-07-16 MED FILL — POLYETHYLENE GLYCOL 3350 PO: 30 days supply | Qty: 510 | Fill #0

## 2015-07-16 MED FILL — METHOCARBAMOL 500 MG TABLET: 500 | 20 days supply | Qty: 60 | Fill #0

## 2015-07-16 MED FILL — QUETIAPINE FUMARATE 25 MG T: 25 | 30 days supply | Qty: 30 | Fill #0

## 2015-07-16 MED FILL — AMLODIPINE BESYLATE 5 MG TA: 5 | 30 days supply | Qty: 30 | Fill #0

## 2015-07-16 MED FILL — ?CYCLOBENZAPRINE 10 MG TABL: 10 | 30 days supply | Qty: 30 | Fill #0

## 2015-07-16 MED FILL — TERBINAFINE HCL 250 MG TAB: 250 | 30 days supply | Qty: 30 | Fill #0

## 2015-07-16 MED FILL — GABAPENTIN 300 MG CAPSULE: 300 | 30 days supply | Qty: 30 | Fill #0

## 2015-07-16 MED FILL — ?SPIRONOLACTONE 25 MG TABLE: 25 MG | 30 days supply | Qty: 30 | Fill #0

## 2015-07-16 NOTE — Telephone Encounter (Signed)
Date of birth verified by pt Lab results given Continue taking medication as prescribed  Pt verbalized understanding  

## 2015-08-07 ENCOUNTER — Encounter: Payer: Medicaid Other | Admitting: Pharmacist

## 2015-08-28 ENCOUNTER — Other Ambulatory Visit: Payer: Self-pay | Admitting: Family Medicine

## 2015-08-28 MED FILL — ?CYCLOBENZAPRINE 10 MG TABL: 10 | 30 days supply | Qty: 30 | Fill #1

## 2015-09-25 ENCOUNTER — Encounter: Payer: Self-pay | Admitting: Family Medicine

## 2015-09-25 ENCOUNTER — Encounter: Payer: Self-pay | Admitting: Clinical

## 2015-09-25 ENCOUNTER — Ambulatory Visit: Payer: Medicaid Other | Attending: Family Medicine | Admitting: Family Medicine

## 2015-09-25 VITALS — BP 132/88 | HR 76 | Temp 98.5°F | Resp 16 | Ht 64.0 in | Wt 238.0 lb

## 2015-09-25 DIAGNOSIS — Z79899 Other long term (current) drug therapy: Secondary | ICD-10-CM | POA: Diagnosis not present

## 2015-09-25 DIAGNOSIS — M79661 Pain in right lower leg: Secondary | ICD-10-CM | POA: Insufficient documentation

## 2015-09-25 DIAGNOSIS — I10 Essential (primary) hypertension: Secondary | ICD-10-CM | POA: Diagnosis not present

## 2015-09-25 DIAGNOSIS — M25562 Pain in left knee: Secondary | ICD-10-CM | POA: Diagnosis not present

## 2015-09-25 DIAGNOSIS — M25561 Pain in right knee: Secondary | ICD-10-CM | POA: Diagnosis not present

## 2015-09-25 LAB — COMPLETE METABOLIC PANEL WITH GFR
ALBUMIN: 3.6 g/dL (ref 3.6–5.1)
ALK PHOS: 60 U/L (ref 33–115)
ALT: 21 U/L (ref 6–29)
AST: 15 U/L (ref 10–30)
BUN: 5 mg/dL — AB (ref 7–25)
CALCIUM: 8.8 mg/dL (ref 8.6–10.2)
CO2: 22 mmol/L (ref 20–31)
Chloride: 105 mmol/L (ref 98–110)
Creat: 0.62 mg/dL (ref 0.50–1.10)
GFR, Est African American: >89 mL/min (ref >=60)
GFR, Est Non African American: >89 mL/min (ref >=60)
Glucose, Bld: 89 mg/dL (ref 65–99)
Potassium: 3.7 mmol/L (ref 3.5–5.3)
Sodium: 137 mmol/L (ref 135–146)
Total Bilirubin: 1.1 mg/dL (ref 0.2–1.2)
Total Protein: 6.4 g/dL (ref 6.1–8.1)

## 2015-09-25 LAB — CBC
HCT: 36.1 % (ref 35.0–45.0)
Hemoglobin: 12.4 g/dL (ref 11.7–15.5)
MCH: 30.6 pg (ref 27.0–33.0)
MCHC: 34.3 g/dL (ref 32.0–36.0)
MCV: 89.1 fL (ref 80.0–100.0)
MPV: 8.5 fL (ref 7.5–12.5)
Platelets: 327 10*3/uL (ref 140–400)
RBC: 4.05 MIL/uL (ref 3.80–5.10)
RDW: 14.3 % (ref 11.0–15.0)
WBC: 6.5 10*3/uL (ref 3.8–10.8)

## 2015-09-25 MED ORDER — NAPROXEN 500 MG PO TABS: 500.0000 mg | ORAL_TABLET | Freq: Two times a day (BID) | ORAL | Status: DC | PRN

## 2015-09-25 MED ORDER — SPIRONOLACTONE 25 MG PO TABS: 25.0000 mg | ORAL_TABLET | Freq: Every day | ORAL | Status: DC

## 2015-09-25 MED FILL — ?SPIRONOLACTONE 25 MG TABLE: 25 | 30 days supply | Qty: 30 | Fill #1

## 2015-09-25 MED FILL — NAPROXEN 500 MG TABLET: 500 | 15 days supply | Qty: 30 | Fill #0

## 2015-09-25 MED FILL — METHOCARBAMOL 500 MG TABLET: 500 | 20 days supply | Qty: 60 | Fill #1

## 2015-09-25 NOTE — Progress Notes (Signed)
F/U knee pain-bilateral  Pain scale #6 No tobacco user  No suicidal thoughts in the past two weeks

## 2015-09-25 NOTE — Assessment & Plan Note (Signed)
Pain and swelling in R calf,   D dimer to r/o DVT Naproxen prn pain

## 2015-09-25 NOTE — Progress Notes (Signed)
Subjective:  Patient ID: Jacqueline Juarez, female    DOB: 06/11/1981  Age: 36 y.o. MRN: [id number]  CC: Knee Pain   HPI Jacqueline Juarez presents for    1. Knee pain: for past 2-3 months. Worse with walking. Worse on R side. No swelling or injury. She has tried tylenol without relief of pain. Pain is in knee but also in R calf. There has been no injury. No knee or leg swelling.  2. Hypertension: Patient continues to take Aldactone daily she reports polyuria. No headache, chest pain, shortness of breath or leg swelling. .    Social History  Substance Use Topics  . Smoking status: Never Smoker   . Smokeless tobacco: Never Used  . Alcohol Use: Yes     Comment: rare    Outpatient Prescriptions Prior to Visit  Medication Sig Dispense Refill  . methocarbamol (ROBAXIN) 500 MG tablet Take 1 tablet (500 mg total) by mouth every 8 (eight) hours as needed for muscle spasms. 60 tablet 2  . Multiple Vitamin (MULTIVITAMIN WITH MINERALS) TABS Take 1 tablet by mouth every morning.    . polyethylene glycol powder (GLYCOLAX/MIRALAX) powder Take 17 g by mouth daily. 3350 g 1  . pregabalin (LYRICA) 50 MG capsule Take 1 capsule (50 mg total) by mouth 3 (three) times daily. 90 capsule 2  . spironolactone (ALDACTONE) 25 MG tablet Take 1 tablet (25 mg total) by mouth daily. 90 tablet 1  . terbinafine (LAMISIL) 250 MG tablet Take 250 mg by mouth daily.     No facility-administered medications prior to visit.    ROS Review of Systems  Constitutional: Negative for fever and chills.  Eyes: Negative for visual disturbance.  Respiratory: Negative for shortness of breath.   Cardiovascular: Negative for chest pain.  Gastrointestinal: Negative for abdominal pain and blood in stool.  Musculoskeletal: Positive for arthralgias. Negative for back pain.  Skin: Negative for rash.  Allergic/Immunologic: Negative for immunocompromised state.  Hematological: Negative for adenopathy. Does not bruise/bleed  easily.  Psychiatric/Behavioral: Negative for suicidal ideas and dysphoric mood.    Objective:  BP 132/88 mmHg  Pulse 76  Temp(Src) 98.5 F (36.9 C) (Oral)  Resp 16  Ht  (1.626 m)  Wt 238 lb (107.956 kg)  BMI 40.83 kg/m2  SpO2 100%  LMP 07/23/2015  BP/Weight 09/25/2015 07/10/2015 03/28/2015  Systolic BP 132 127 133  Diastolic BP 88 83 84  Wt. (Lbs) 238 227 221  BMI 40.83 38.95 37.92    Physical Exam  Constitutional: She is oriented to person, place, and time. She appears well-developed and well-nourished. No distress.  HENT:  Head: Normocephalic and atraumatic.  Cardiovascular: Normal rate, regular rhythm, normal heart sounds and intact distal pulses.   Pulmonary/Chest: Effort normal and breath sounds normal.  Musculoskeletal: She exhibits no edema.       Right knee: Normal.       Left knee: Normal.       Right lower leg: She exhibits tenderness. She exhibits no bony tenderness, no swelling, no edema, no deformity and no laceration.       Legs: Neurological: She is alert and oriented to person, place, and time.  Skin: Skin is warm and dry. No rash noted.  Psychiatric: She has a normal mood and affect.     Assessment & Plan:   There are no diagnoses linked to this encounter. Lesleyanne was seen today for knee pain.  Diagnoses and all orders for this visit:  Right calf  pain -     COMPLETE METABOLIC PANEL WITH GFR -     D-dimer, quantitative (not at [facility]) -     CBC -     naproxen (NAPROSYN) 500 MG tablet; Take 1 tablet (500 mg total) by mouth 2 (two) times daily as needed (calf pain).  Bilateral knee pain  Essential hypertension -     COMPLETE METABOLIC PANEL WITH GFR -     spironolactone (ALDACTONE) 25 MG tablet; Take 1 tablet (25 mg total) by mouth daily.   Meds ordered this encounter  Medications  . naproxen (NAPROSYN) 500 MG tablet    Sig: Take 1 tablet (500 mg total) by mouth 2 (two) times daily as needed (calf pain).    Dispense:  30 tablet    Refill:   1  . spironolactone (ALDACTONE) 25 MG tablet    Sig: Take 1 tablet (25 mg total) by mouth daily.    Dispense:  90 tablet    Refill:  2    Follow-up: No Follow-up on file.   Dessa PhiJosalyn Carrieanne Kleen MD   Will

## 2015-09-25 NOTE — Patient Instructions (Addendum)
Jacqueline Juarez was seen today for knee pain.  Diagnoses and all orders for this visit:  Right calf pain -     COMPLETE METABOLIC PANEL WITH GFR -     D-dimer, quantitative (not at [facility]) -     CBC -     naproxen (NAPROSYN) 500 MG tablet; Take 1 tablet (500 mg total) by mouth 2 (two) times daily as needed (calf pain).  Bilateral knee pain  Essential hypertension -     COMPLETE METABOLIC PANEL WITH GFR -     spironolactone (ALDACTONE) 25 MG tablet; Take 1 tablet (25 mg total) by mouth daily.   The aldactone will make you urinate more.   Continue to walk as exercise is always good for you Stay well hydrate Naproxen for pain You will be contacted with lab results.  F/u in 6 week for knee and calf pain   Dr. Armen PickupFunches

## 2015-09-25 NOTE — Assessment & Plan Note (Signed)
A: well controlled on aldactone. Patient taking aldactone due to history of recurrent low potassium P: Check CMP Continue aldactone

## 2015-09-25 NOTE — Progress Notes (Signed)
Depression screen [facility] 2/9 09/25/2015 09/25/2015 07/10/2015 03/28/2015 01/29/2015  Decreased Interest 0 0 0 0 0  Down, Depressed, Hopeless 0 0 0 0 0  PHQ - 2 Score 0 0 0 0 0  Altered sleeping 3 3 2  - -  Tired, decreased energy 3 3 1  - -  Change in appetite 0 0 0 - -  Feeling bad or failure about yourself  0 0 0 - -  Trouble concentrating 0 0 0 - -  Moving slowly or fidgety/restless 0 0 0 - -  Suicidal thoughts 0 0 0 - -  PHQ-9 Score 6 6 3  - -    GAD 7 : Generalized Anxiety Score 09/25/2015 09/25/2015 07/10/2015  Nervous, Anxious, on Edge 0 0 0  Control/stop worrying 0 0 0  Worry too much - different things 1 1 0  Trouble relaxing 0 0 0  Restless 0 0 0  Easily annoyed or irritable 0 0 0  Afraid - awful might happen 0 0 0  Total GAD 7 Score 1 1 0

## 2015-09-26 LAB — D-DIMER, QUANTITATIVE (NOT AT ARMC)

## 2015-09-29 NOTE — Addendum Note (Signed)
Addended by: Dessa PhiFUNCHES, Hadlyn Amero on: 09/29/2015 08:22 AM   Modules accepted: Orders, SmartSet

## 2015-09-30 ENCOUNTER — Telehealth: Payer: Self-pay | Admitting: Family Medicine

## 2015-09-30 NOTE — Telephone Encounter (Signed)
Pt. Called requesting her Lab results. Please f/u with pt. °

## 2015-10-01 NOTE — Telephone Encounter (Signed)
Date of birth verified by pt  Normal lab results given  Pt advised to return to clinic for D-Dimer lab work  Pt verbalized understanding  Stated will return for lab work

## 2015-10-01 NOTE — Telephone Encounter (Signed)
-----   Message from Dessa PhiJosalyn Funches, MD sent at 09/29/2015  8:21 AM EDT ----- All labs normal Please ask patient to return for D dimer test

## 2015-10-02 ENCOUNTER — Ambulatory Visit: Payer: Medicaid Other | Attending: Family Medicine

## 2015-10-02 DIAGNOSIS — M79661 Pain in right lower leg: Secondary | ICD-10-CM | POA: Insufficient documentation

## 2015-10-02 DIAGNOSIS — Z1321 Encounter for screening for nutritional disorder: Secondary | ICD-10-CM

## 2015-10-02 NOTE — Patient Instructions (Signed)
Patient's here for lab visit only. 

## 2015-10-03 LAB — D-DIMER, QUANTITATIVE (NOT AT ARMC): D DIMER QUANT: 0.52 ug{FEU}/mL — AB (ref 0.00–0.48)

## 2015-10-07 ENCOUNTER — Other Ambulatory Visit: Payer: Self-pay | Admitting: Family Medicine

## 2015-10-07 DIAGNOSIS — M79661 Pain in right lower leg: Secondary | ICD-10-CM

## 2015-10-07 DIAGNOSIS — R7989 Other specified abnormal findings of blood chemistry: Secondary | ICD-10-CM

## 2015-10-07 MED ORDER — ASPIRIN EC 81 MG PO TBEC: 81.0000 mg | DELAYED_RELEASE_TABLET | Freq: Every day | ORAL | Status: DC

## 2015-10-08 ENCOUNTER — Emergency Department (HOSPITAL_COMMUNITY)
Admission: EM | Admit: 2015-10-08 | Discharge: 2015-10-08 | Disposition: A | Discharge status: Home / Self Care | Payer: Medicaid Other | Attending: Emergency Medicine | Admitting: Emergency Medicine

## 2015-10-08 ENCOUNTER — Emergency Department (HOSPITAL_BASED_OUTPATIENT_CLINIC_OR_DEPARTMENT_OTHER)
Admit: 2015-10-08 | Discharge: 2015-10-08 | Disposition: A | Discharge status: Home / Self Care | Payer: Medicaid Other | Attending: Emergency Medicine | Admitting: Emergency Medicine

## 2015-10-08 ENCOUNTER — Telehealth: Payer: Self-pay | Admitting: *Deleted

## 2015-10-08 ENCOUNTER — Encounter (HOSPITAL_COMMUNITY): Payer: Self-pay | Admitting: Emergency Medicine

## 2015-10-08 DIAGNOSIS — Z79899 Other long term (current) drug therapy: Secondary | ICD-10-CM | POA: Diagnosis not present

## 2015-10-08 DIAGNOSIS — G629 Polyneuropathy, unspecified: Secondary | ICD-10-CM | POA: Insufficient documentation

## 2015-10-08 DIAGNOSIS — Z880 Allergy status to penicillin: Secondary | ICD-10-CM | POA: Insufficient documentation

## 2015-10-08 DIAGNOSIS — Z8639 Personal history of other endocrine, nutritional and metabolic disease: Secondary | ICD-10-CM | POA: Insufficient documentation

## 2015-10-08 DIAGNOSIS — M1990 Unspecified osteoarthritis, unspecified site: Secondary | ICD-10-CM | POA: Insufficient documentation

## 2015-10-08 DIAGNOSIS — M79609 Pain in unspecified limb: Secondary | ICD-10-CM | POA: Diagnosis not present

## 2015-10-08 DIAGNOSIS — F410 Panic disorder [episodic paroxysmal anxiety] without agoraphobia: Secondary | ICD-10-CM | POA: Diagnosis not present

## 2015-10-08 DIAGNOSIS — I10 Essential (primary) hypertension: Secondary | ICD-10-CM | POA: Diagnosis not present

## 2015-10-08 DIAGNOSIS — M79661 Pain in right lower leg: Secondary | ICD-10-CM | POA: Diagnosis not present

## 2015-10-08 DIAGNOSIS — G8929 Other chronic pain: Secondary | ICD-10-CM | POA: Insufficient documentation

## 2015-10-08 NOTE — Discharge Instructions (Signed)

## 2015-10-08 NOTE — Progress Notes (Signed)
*  PRELIMINARY RESULTS* Vascular Ultrasound Right lower extremity venous duplex has been completed.  Preliminary findings: No evidence of DVT in visualized veins or baker's cyst.  Farrel DemarkJill Eunice, RDMS, RVT   10/08/2015, 11:15 AM

## 2015-10-08 NOTE — ED Notes (Signed)
Pt sts sent here for eval of possible DVT when had blood work from PCP; pt sts right leg pain

## 2015-10-08 NOTE — Telephone Encounter (Signed)
Date of birth verified by pt  Lab results given   Venous doppler appointment on tomorrow 10[facility]08/2015 at 4[facility], Pt aware

## 2015-10-08 NOTE — Telephone Encounter (Signed)
-----   Message from Jacqueline PhiJosalyn Funches, MD sent at 10/07/2015  4:16 PM EDT ----- Elevated D dimer  Since patient has R leg pain She will need R LE venous doppler to rule out blood clot, this has been ordered While awaiting results she needs to take aspirin 81 mg daily

## 2015-10-08 NOTE — ED Provider Notes (Signed)
CSN: [id number]     Arrival date & time 10/08/15  0920 History   First MD Initiated Contact with Patient 10/08/15 1133     Chief Complaint  Patient presents with  . Leg Pain   Jacqueline Juarez is a 35 y.o. female who presents to the emergency department complaining of right calf pain ongoing for the past 2 or more weeks. The patient was seen and evaluated by her primary care provider who ordered a d-dimer. This d-dimer returned mildly elevated at 0.52. The patient's primary care provider placed an order for an ultrasound of her right lower extreme major rule out DVT. The patient presented to the emergency department for this study. The patient reports currently her pain is around a 2 out of 10. She reports sometimes it can be worse with movement. She reports the naproxen that her primary care provider provided to her has helped her with her pain. She denies any calf edema. She denies any history of PEs or DVTs. She denies any history of blood clotting disorder such as factor V Leiden, protein C or S deficiency. She denies any recent long travel and she is not a smoker. She denies fevers, chest pain, shortness of breath, trouble breathing, wheezing, hemoptysis, leg swelling, abdominal pain, nausea, vomiting, diarrhea or rashes.  Patient is a 35 y.o. female presenting with leg pain. The history is provided by the patient. No language interpreter was used.  Leg Pain Associated symptoms: no back pain, no fever and no neck pain     Past Medical History  Diagnosis Date  . Chronic back pain   . Paresthesias     left hand, bilat feet  . High cholesterol Dx 2014  . Hypertension DX 2014  . Lumbar radiculopathy Dx 2015  . Peripheral neuropathy (HCC)     left hand  . Anxiety     At age 35  . Panic     At age 16  . Arthritis Dx 2014   Past Surgical History  Procedure Laterality Date  . No past surgeries     Family History  Problem Relation Age of Onset  . Arthritis Mother   . Arthritis  Father    Social History  Substance Use Topics  . Smoking status: Never Smoker   . Smokeless tobacco: Never Used  . Alcohol Use: Yes     Comment: rare   OB History    No data available     Review of Systems  Constitutional: Negative for fever and chills.  HENT: Negative for congestion and sore throat.   Eyes: Negative for visual disturbance.  Respiratory: Negative for cough, shortness of breath and wheezing.   Cardiovascular: Negative for chest pain and palpitations.  Gastrointestinal: Negative for nausea, vomiting and abdominal pain.  Genitourinary: Negative for dysuria.  Musculoskeletal: Positive for arthralgias. Negative for back pain and neck pain.  Skin: Negative for color change, rash and wound.  Neurological: Negative for weakness, numbness and headaches.      Allergies  Penicillins  Home Medications   Prior to Admission medications   Medication Sig Start Date End Date Taking? Authorizing Provider  methocarbamol (ROBAXIN) 500 MG tablet Take 1 tablet (500 mg total) by mouth every 8 (eight) hours as needed for muscle spasms. 04/17/15  Yes Josalyn Funches, MD  naproxen (NAPROSYN) 500 MG tablet Take 1 tablet (500 mg total) by mouth 2 (two) times daily as needed (calf pain). 09/25/15  Yes Dessa Phi, MD  spironolactone (ALDACTONE) 25 MG  tablet Take 1 tablet (25 mg total) by mouth daily. 09/25/15  Yes Dessa Phi, MD  aspirin EC 81 MG tablet Take 1 tablet (81 mg total) by mouth daily. Patient not taking: Reported on 10/08/2015 10/07/15   Dessa Phi, MD  Multiple Vitamin (MULTIVITAMIN WITH MINERALS) TABS Take 1 tablet by mouth every morning.    Historical Provider, MD  pregabalin (LYRICA) 50 MG capsule Take 1 capsule (50 mg total) by mouth 3 (three) times daily. Patient not taking: Reported on 10/08/2015 03/28/15   Dessa Phi, MD  terbinafine (LAMISIL) 250 MG tablet Take 250 mg by mouth daily.    Historical Provider, MD   BP 148/99 mmHg  Pulse 82  Temp(Src)  98.2 F (36.8 C) (Oral)  Resp 18  SpO2 100%  LMP 07/23/2015 Physical Exam  Constitutional: She appears well-developed and well-nourished. No distress.  Nontoxic appearing.  HENT:  Head: Normocephalic and atraumatic.  Mouth/Throat: Oropharynx is clear and moist.  Eyes: Conjunctivae are normal. Pupils are equal, round, and reactive to light. Right eye exhibits no discharge. Left eye exhibits no discharge.  Neck: Neck supple.  Cardiovascular: Normal rate, regular rhythm, normal heart sounds and intact distal pulses.  Exam reveals no gallop and no friction rub.   No murmur heard. Bilateral radial, posterior tibialis and dorsalis pedis pulses are intact. Good capillary refill to her bilateral distal toes.  Pulmonary/Chest: Effort normal and breath sounds normal. No respiratory distress. She has no wheezes. She has no rales.  Lungs clear to auscultation bilaterally.  Abdominal: Soft. There is no tenderness.  Musculoskeletal: Normal range of motion. She exhibits no edema or tenderness.  No lower extremity edema or tenderness to palpation. Patient has good range of motion of her bilateral lower extremities with good strength.  Lymphadenopathy:    She has no cervical adenopathy.  Neurological: She is alert. Coordination normal.  Sensation is intact to her bilateral lower extremities.  Skin: Skin is warm and dry. No rash noted. She is not diaphoretic. No erythema. No pallor.  No rashes or erythema noted to her lower extremities.  Psychiatric: She has a normal mood and affect. Her behavior is normal.  Nursing note and vitals reviewed.   ED Course  Procedures (including critical care time) Labs Review Labs Reviewed - No data to display  Imaging Review No results found.    EKG Interpretation None      Filed Vitals:   10/08/15 0930 10/08/15 1130 10/08/15 1145 10/08/15 1200  BP: 156/102 151/97 148/99   Pulse: 80 84 80 82  Temp: 98.2 F (36.8 C)     TempSrc: Oral     Resp: 18      SpO2: 100% 99% 93% 100%     MDM   Meds given in ED:  Medications - No data to display  New Prescriptions   No medications on file    Final diagnoses:  Right calf pain   This  is a 35 y.o. female who presents to the emergency department complaining of right calf pain ongoing for the past 2 or more weeks. The patient was seen and evaluated by her primary care provider who ordered a d-dimer. This d-dimer returned mildly elevated at 0.52. The patient's primary care provider placed an order for an ultrasound of her right lower extreme major rule out DVT. The patient presented to the emergency department for this study. The patient reports currently her pain is around a 2 out of 10. She reports sometimes it can be worse  with movement. She reports the naproxen that her primary care provider provided to her has helped her with her pain. She denies any calf edema. On exam the patient is afebrile and nontoxic appearing. No calf edema or tenderness to palpation. She denies any chest pain, shortness of breath or trouble breathing. She is not to, tachycardic or hypoxic. Her oxygen saturation is 100% on room air. DVT study is negative. Will have her follow up closely with her primary care provider I discussed strict and specific return precautions with the patient. I advised the patient to follow-up with their primary care provider this week. I advised the patient to return to the emergency department with new or worsening symptoms or new concerns. The patient verbalized understanding and agreement with plan.       Everlene FarrierWilliam Iyauna Sing, PA-C 10/08/15 1221  Linwood DibblesJon Knapp, MD 10/08/15 [phone]

## 2015-10-09 ENCOUNTER — Ambulatory Visit (HOSPITAL_COMMUNITY): Payer: Medicaid Other

## 2015-12-26 ENCOUNTER — Ambulatory Visit: Payer: Medicaid Other | Admitting: Family Medicine

## 2016-01-20 MED FILL — NAPROXEN 500 MG TABLET: 500 | 15 days supply | Qty: 30 | Fill #1

## 2016-01-20 MED FILL — ?QUETIAPINE FUMARATE 25 MG: 25 MG | 30 days supply | Qty: 30 | Fill #1

## 2016-01-20 MED FILL — ?SPIRONOLACTONE 25 MG TABLE: 25 | 30 days supply | Qty: 30 | Fill #0

## 2016-01-20 MED FILL — CYCLOBENZAPRINE 10 MG TAB: 10 | 30 days supply | Qty: 30 | Fill #2

## 2016-01-28 ENCOUNTER — Ambulatory Visit: Payer: Medicaid Other | Admitting: Family Medicine

## 2016-02-27 ENCOUNTER — Ambulatory Visit: Payer: Medicaid Other | Attending: Internal Medicine | Admitting: Physician Assistant

## 2016-02-27 VITALS — BP 123/85 | HR 83 | Temp 98.5°F | Resp 16 | Ht 64.0 in | Wt 232.0 lb

## 2016-02-27 DIAGNOSIS — M25521 Pain in right elbow: Secondary | ICD-10-CM

## 2016-02-27 DIAGNOSIS — Z113 Encounter for screening for infections with a predominantly sexual mode of transmission: Secondary | ICD-10-CM | POA: Diagnosis not present

## 2016-02-27 DIAGNOSIS — I10 Essential (primary) hypertension: Secondary | ICD-10-CM | POA: Insufficient documentation

## 2016-02-27 DIAGNOSIS — M79601 Pain in right arm: Secondary | ICD-10-CM | POA: Diagnosis present

## 2016-02-27 DIAGNOSIS — Z7982 Long term (current) use of aspirin: Secondary | ICD-10-CM | POA: Diagnosis not present

## 2016-02-27 DIAGNOSIS — Z79899 Other long term (current) drug therapy: Secondary | ICD-10-CM | POA: Insufficient documentation

## 2016-02-27 DIAGNOSIS — M79621 Pain in right upper arm: Secondary | ICD-10-CM | POA: Diagnosis not present

## 2016-02-27 DIAGNOSIS — R5383 Other fatigue: Secondary | ICD-10-CM | POA: Diagnosis not present

## 2016-02-27 LAB — COMPREHENSIVE METABOLIC PANEL
ALK PHOS: 64 U/L (ref 33–115)
ALT: 21 U/L (ref 6–29)
AST: 16 U/L (ref 10–30)
Albumin: 4 g/dL (ref 3.6–5.1)
BILIRUBIN TOTAL: 1 mg/dL (ref 0.2–1.2)
BUN: 8 mg/dL (ref 7–25)
CO2: 24 mmol/L (ref 20–31)
Calcium: 9.4 mg/dL (ref 8.6–10.2)
Chloride: 104 mmol/L (ref 98–110)
Creat: 0.6 mg/dL (ref 0.50–1.10)
GLUCOSE: 86 mg/dL (ref 65–99)
Potassium: 4.4 mmol/L (ref 3.5–5.3)
Sodium: 137 mmol/L (ref 135–146)
Total Protein: 7.1 g/dL (ref 6.1–8.1)

## 2016-02-27 LAB — CBC WITH DIFFERENTIAL/PLATELET
BASOS ABS: 65 {cells}/uL (ref 0–200)
Basophils Relative: 1 %
EOS ABS: 65 {cells}/uL (ref 15–500)
Eosinophils Relative: 1 %
HCT: 38 % (ref 35.0–45.0)
HEMOGLOBIN: 13 g/dL (ref 11.7–15.5)
LYMPHS ABS: 2210 {cells}/uL (ref 850–3900)
Lymphocytes Relative: 34 %
MCH: 30.3 pg (ref 27.0–33.0)
MCHC: 34.2 g/dL (ref 32.0–36.0)
MCV: 88.6 fL (ref 80.0–100.0)
MONOS PCT: 12 %
MPV: 8.2 fL (ref 7.5–12.5)
Monocytes Absolute: 780 cells/uL (ref 200–950)
Neutro Abs: 3380 cells/uL (ref 1500–7800)
Neutrophils Relative %: 52 %
Platelets: 354 10*3/uL (ref 140–400)
RBC: 4.29 MIL/uL (ref 3.80–5.10)
RDW: 14 % (ref 11.0–15.0)
WBC: 6.5 10*3/uL (ref 3.8–10.8)

## 2016-02-27 MED ORDER — NAPROXEN 500 MG PO TABS: 500.0000 mg | ORAL_TABLET | Freq: Two times a day (BID) | ORAL | 1 refills | Status: DC | PRN

## 2016-02-27 MED ORDER — METHOCARBAMOL 500 MG PO TABS: 500.0000 mg | ORAL_TABLET | Freq: Three times a day (TID) | ORAL | 2 refills | Status: DC | PRN

## 2016-02-27 NOTE — Progress Notes (Signed)
C/C arm pain x 1 week due injury  Pain scale #6 No tobacco user  No suicidal thoughts in the past two weeks cbg

## 2016-02-27 NOTE — Progress Notes (Signed)
Patient ID: [name], female   DOB: [birth year], 35 y.o.   MRN: [id number]   Jacqueline Juarez, is a 35 y.o. female  [id number]  [id number]  DOB - [birth year]  Subjective:  Chief Complaint and HPI: Jacqueline Juarez is a 35 y.o. female here today for pain in her entire R arm after falling about 1+ weeks ago in the middle of the night.  She says she got up to check on her dog when she tripped on uneven surface and fell to the side landing on her R arm and side.  She has been sore and tender since.  She denies neck pain.  No weakness or paresthesias.  Naproxen and methocarbamol she had prescribed for other aches and pains has helped with the pain, but she is out of these.  She would also like HIV and syphilis testing. She denies vaginal discharge or pelvic pain.   She is fatigued and wants to have her Hgb and electrolytes checked because they have been abnormal in the past.     ROS:   Constitutional:  No f/c, No night sweats, No unexplained weight loss. + fatigue EENT:  No vision changes, No blurry vision, No hearing changes. No mouth, throat, or ear problems.  Respiratory: No cough, No SOB Cardiac: No CP, no palpitations GI:  No abd pain, No N/V/D. GU: No Urinary s/sx Musculoskeletal: +R arm pain Neuro: No headache, no dizziness, no motor weakness.  Skin: No rash Endocrine:  No polydipsia. No polyuria.  Psych: Denies SI/HI  No problems updated.  ALLERGIES: Allergies  Allergen Reactions  . Penicillins Hives    Has patient had a PCN reaction causing immediate rash, facial/tongue/throat swelling, SOB or lightheadedness with hypotension: No Has patient had a PCN reaction causing severe rash involving mucus membranes or skin necrosis: No Has patient had a PCN reaction that required hospitalization No Has patient had a PCN reaction occurring within the last 10 years: No If all of the above answers are "NO", then may proceed with Cephalosporin use.    PAST MEDICAL  HISTORY: Past Medical History:  Diagnosis Date  . Anxiety    At age 23  . Arthritis Dx 2014  . Chronic back pain   . High cholesterol Dx 2014  . Hypertension DX 2014  . Lumbar radiculopathy Dx 2015  . Panic    At age 23  . Paresthesias    left hand, bilat feet  . Peripheral neuropathy (HCC)    left hand    MEDICATIONS AT HOME: Prior to Admission medications   Medication Sig Start Date End Date Taking? Authorizing Provider  methocarbamol (ROBAXIN) 500 MG tablet Take 1 tablet (500 mg total) by mouth every 8 (eight) hours as needed for muscle spasms. 02/27/16  Yes Anders Simmonds, PA-C  Multiple Vitamin (MULTIVITAMIN WITH MINERALS) TABS Take 1 tablet by mouth every morning.   Yes Historical Provider, MD  naproxen (NAPROSYN) 500 MG tablet Take 1 tablet (500 mg total) by mouth 2 (two) times daily as needed (calf pain). 02/27/16  Yes Anders Simmonds, PA-C  spironolactone (ALDACTONE) 25 MG tablet Take 1 tablet (25 mg total) by mouth daily. 09/25/15  Yes Dessa Phi, MD  aspirin EC 81 MG tablet Take 1 tablet (81 mg total) by mouth daily. Patient not taking: Reported on 10/08/2015 10/07/15   Dessa Phi, MD  pregabalin (LYRICA) 50 MG capsule Take 1 capsule (50 mg total) by mouth 3 (three) times daily. Patient not taking: Reported on 02/27/2016 03/28/15  Dessa PhiJosalyn Funches, MD  terbinafine (LAMISIL) 250 MG tablet Take 250 mg by mouth daily.    Historical Provider, MD     Objective:  EXAM:   Vitals:   02/27/16 1107  BP: 123/85  Pulse: 83  Resp: 16  Temp: 98.5 F (36.9 C)  TempSrc: Oral  SpO2: 99%  Weight: 232 lb (105.2 kg)  Height: 5\' 4"  (1.626 m)    General appearance : A&OX3. NAD. Non-toxic-appearing HEENT: Atraumatic and Normocephalic.  PERRLA. EOM intact.  TM clear B. Mouth-MMM, post pharynx WNL w/o erythema, No PND. Neck: supple, no JVD. No cervical lymphadenopathy. No thyromegaly Chest/Lungs:  Breathing-non-labored, Good air entry bilaterally, breath sounds normal without  rales, rhonchi, or wheezing  CVS: S1 S2 regular, no murmurs, gallops, rubs  Abdomen: Bowel sounds present, Non tender and not distended with no gaurding, rigidity or rebound. Extremities: Bilateral Lower Ext shows no edema, both legs are warm to touch with = pulse throughout.  RUE vs LUE examined.  No contusions, ecchymosis or abnormality of R shoulder, elbow, hand, or fingers.  S&ROM WNL.  Sensory also intact.   Neurology:  CN II-XII grossly intact, Non focal.   Psych:  TP linear. J/I WNL. Normal speech. Appropriate eye contact and affect.  Skin:  No Rash  Data Review Lab Results  Component Value Date   HGBA1C 5.0 09/30/2014     Assessment & Plan   1. Pain in joint, upper arm, right General soreness S/p fall ~ 1week ago-no point tenderness that indicate need for xray- - methocarbamol (ROBAXIN) 500 MG tablet; Take 1 tablet (500 mg total) by mouth every 8 (eight) hours as needed for muscle spasms.  Dispense: 60 tablet; Refill: 2 - naproxen (NAPROSYN) 500 MG tablet; Take 1 tablet (500 mg total) by mouth 2 (two) times daily as needed prn pain  Dispense: 30 tablet; Refill: 1  2. Screening for STD (sexually transmitted disease) - HIV antibody (with reflex) - RPR  3. Other fatigue - Comprehensive metabolic panel - CBC with Differential/Platelet     Patient have been counseled extensively about nutrition and exercise  Return in about 1 month (around 03/28/2016) for Dr Armen PickupFunches follow-up.  The patient was given clear instructions to go to ER or return to medical center if symptoms don't improve, worsen or new problems develop. The patient verbalized understanding. The patient was told to call to get lab results if they haven't heard anything in the next week.     Georgian CoAngela Alfonzia Woolum, PA-C [facility], [place] [phone]   02/27/2016, 1:46 PM

## 2016-02-28 LAB — RPR

## 2016-02-28 LAB — HIV ANTIBODY (ROUTINE TESTING W REFLEX): HIV 1&2 Ab, 4th Generation: NONREACTIVE

## 2016-03-01 MED FILL — NAPROXEN 500 MG TABLET: 500 | 15 days supply | Qty: 30 | Fill #0

## 2016-03-01 MED FILL — METHOCARBAMOL 500 MG TABLET: 500 | 20 days supply | Qty: 60 | Fill #0

## 2016-03-02 ENCOUNTER — Telehealth: Payer: Self-pay

## 2016-03-02 NOTE — Telephone Encounter (Signed)
-----   Message from Anders SimmondsAngela M McClung, [place]-C sent at 03/02/2016  2:33 PM EDT ----- Please call patient and let her know all her labs are normal.  Negative HIV, syphillis.  Her kidney function, liver function, blood sugar, and blood count are all normal.  Thanks, Georgian CoAngela McClung, PA-C

## 2016-03-02 NOTE — Telephone Encounter (Signed)
Patient hipaa verif per guidelines. Patient advised per PA-C McClung: all her labs are normal. Negative HIV, syphillis. Her kidney function, liver function, blood sugar, and blood count are all normal. Patient verbalized understanding.  Pollyann KennedyKim Becton, RN, BSN

## 2016-04-26 ENCOUNTER — Ambulatory Visit: Payer: Medicaid Other | Admitting: Family Medicine

## 2016-04-30 ENCOUNTER — Ambulatory Visit: Payer: Medicaid Other

## 2016-06-24 ENCOUNTER — Ambulatory Visit: Payer: Medicaid Other | Admitting: Family Medicine

## 2016-06-28 MED FILL — SPIRONOLACTONE 25 MG TABLET: 25 | 30 days supply | Qty: 30 | Fill #1

## 2016-06-28 MED FILL — METHOCARBAMOL 500 MG TABLET: 500 | 20 days supply | Qty: 60 | Fill #1

## 2016-06-28 MED FILL — NAPROXEN 500 MG TABLET: 500 | 15 days supply | Qty: 30 | Fill #1

## 2016-07-01 ENCOUNTER — Ambulatory Visit: Payer: Medicaid Other | Admitting: Family Medicine

## 2016-07-02 ENCOUNTER — Ambulatory Visit: Payer: Medicaid Other | Admitting: Family Medicine

## 2016-07-26 ENCOUNTER — Ambulatory Visit: Payer: Medicaid Other | Attending: Family Medicine | Admitting: Family Medicine

## 2016-07-26 ENCOUNTER — Encounter: Payer: Self-pay | Admitting: Family Medicine

## 2016-07-26 VITALS — BP 146/102 | HR 72 | Temp 98.5°F | Ht 64.0 in | Wt 246.0 lb

## 2016-07-26 DIAGNOSIS — Z114 Encounter for screening for human immunodeficiency virus [HIV]: Secondary | ICD-10-CM

## 2016-07-26 DIAGNOSIS — Z23 Encounter for immunization: Secondary | ICD-10-CM | POA: Diagnosis not present

## 2016-07-26 DIAGNOSIS — G8929 Other chronic pain: Secondary | ICD-10-CM | POA: Insufficient documentation

## 2016-07-26 DIAGNOSIS — Z7982 Long term (current) use of aspirin: Secondary | ICD-10-CM | POA: Insufficient documentation

## 2016-07-26 DIAGNOSIS — Z79899 Other long term (current) drug therapy: Secondary | ICD-10-CM | POA: Insufficient documentation

## 2016-07-26 DIAGNOSIS — M549 Dorsalgia, unspecified: Secondary | ICD-10-CM | POA: Diagnosis present

## 2016-07-26 DIAGNOSIS — I10 Essential (primary) hypertension: Secondary | ICD-10-CM | POA: Diagnosis not present

## 2016-07-26 DIAGNOSIS — M545 Low back pain: Secondary | ICD-10-CM | POA: Diagnosis not present

## 2016-07-26 DIAGNOSIS — Z3200 Encounter for pregnancy test, result unknown: Secondary | ICD-10-CM | POA: Diagnosis not present

## 2016-07-26 LAB — POCT URINE PREGNANCY: PREG TEST UR: NEGATIVE

## 2016-07-26 MED ORDER — NAPROXEN 500 MG PO TABS: 500.0000 mg | ORAL_TABLET | Freq: Two times a day (BID) | ORAL | 2 refills | Status: DC | PRN

## 2016-07-26 MED ORDER — METHOCARBAMOL 500 MG PO TABS: 500.0000 mg | ORAL_TABLET | Freq: Three times a day (TID) | ORAL | 2 refills | Status: DC | PRN

## 2016-07-26 MED ORDER — CANE MISC: 1.0000 | Freq: Every day | 0 refills | Status: DC

## 2016-07-26 MED ORDER — CLONIDINE HCL 0.1 MG PO TABS
0.1000 mg | ORAL_TABLET | Freq: Once | ORAL | Status: AC
  Administered 2016-07-26: 0.1 mg via ORAL

## 2016-07-26 MED ORDER — SPIRONOLACTONE 50 MG PO TABS
50.0000 mg | ORAL_TABLET | Freq: Every day | ORAL | 5 refills | Status: DC
Start: 2016-07-26 — End: 2017-03-23

## 2016-07-26 NOTE — Assessment & Plan Note (Signed)
A: HTN Med: compliant P: Increase aldactone to 50 mg daily  Aldactone was chosen due to persistently low potassium levels in the past

## 2016-07-26 NOTE — Addendum Note (Signed)
Addended by: Dessa PhiFUNCHES, Daris Aristizabal on: 07/26/2016 02:53 PM   Modules accepted: Orders

## 2016-07-26 NOTE — Assessment & Plan Note (Signed)
chronic back pain  Leg weakness is inconsistent with exam findings and MRI  Plan: Cane advised due to falls Continue muscle relaxer and NSAID for prn use

## 2016-07-26 NOTE — Progress Notes (Signed)
Subjective:  Patient ID: Jacqueline Juarez, female    DOB: [birth year]  Age: 36 y.o. MRN: [id number]  CC: Back Pain and Hypertension   HPI Jacqueline Juarez presents for    1. Chronic back pain: since. Both legs give out at times. She reports falling. Her last fall was last month while she was walking back from the grocery store. She does not use a cane. Her pain level is 2-7/10. Pain is located in midline of back. She denies fecal and urinary incontinence. She does not use a cane. She walks with a limb. She request SCAT services. She comes today with a form.   Lumbar MRI 07/19/2014: IMPRESSION:  Abnormal MRI lumbar spine (without) demonstrating 1. At L5-S1: disc bulging and facet hypertrophy; far left lateral disc bulging displaces the exiting left L5 root; also there is mild biforaminal stenosis.  2. Remaining levels are unremarkable.   2. Hypertension: Patient continues to take Aldactone 25 mg daily.  No polyuria. headache, chest pain, shortness of breath or leg swelling. .    Social History  Substance Use Topics  . Smoking status: Never Smoker  . Smokeless tobacco: Never Used  . Alcohol use Yes     Comment: rare    Outpatient Medications Prior to Visit  Medication Sig Dispense Refill  . aspirin EC 81 MG tablet Take 1 tablet (81 mg total) by mouth daily. (Patient not taking: Reported on 10/08/2015) 30 tablet 5  . methocarbamol (ROBAXIN) 500 MG tablet Take 1 tablet (500 mg total) by mouth every 8 (eight) hours as needed for muscle spasms. 60 tablet 2  . Multiple Vitamin (MULTIVITAMIN WITH MINERALS) TABS Take 1 tablet by mouth every morning.    . naproxen (NAPROSYN) 500 MG tablet Take 1 tablet (500 mg total) by mouth 2 (two) times daily as needed (calf pain). 30 tablet 1  . pregabalin (LYRICA) 50 MG capsule Take 1 capsule (50 mg total) by mouth 3 (three) times daily. (Patient not taking: Reported on 02/27/2016) 90 capsule 2  . spironolactone (ALDACTONE) 25 MG tablet Take 1 tablet  (25 mg total) by mouth daily. 90 tablet 2  . terbinafine (LAMISIL) 250 MG tablet Take 250 mg by mouth daily.     No facility-administered medications prior to visit.     ROS Review of Systems  Constitutional: Negative for chills and fever.  Eyes: Negative for visual disturbance.  Respiratory: Negative for shortness of breath.   Cardiovascular: Negative for chest pain.  Gastrointestinal: Negative for abdominal pain and blood in stool.  Musculoskeletal: Positive for arthralgias. Negative for back pain.  Skin: Negative for rash.  Allergic/Immunologic: Negative for immunocompromised state.  Hematological: Negative for adenopathy. Does not bruise/bleed easily.  Psychiatric/Behavioral: Negative for dysphoric mood and suicidal ideas.    Objective:  BP (!) 165/107 (BP Location: Left Arm, Patient Position: Sitting, Cuff Size: Large)   Pulse 72   Temp 98.5 F (36.9 C) (Oral)   Ht 5\' 4"  (1.626 m)   Wt 246 lb (111.6 kg)   SpO2 100%   BMI 42.23 kg/m   BP/Weight 07/26/2016 02/27/2016 10/08/2015  Systolic BP 165 123 148  Diastolic BP 107 85 99  Wt. (Lbs) 246 232 -  BMI 42.23 39.82 -    Physical Exam  Constitutional: She is oriented to person, place, and time. She appears well-developed and well-nourished. No distress.  HENT:  Head: Normocephalic and atraumatic.  Cardiovascular: Normal rate, regular rhythm, normal heart sounds and intact distal pulses.  Pulmonary/Chest: Effort normal and breath sounds normal.  Musculoskeletal: She exhibits no edema.       Right knee: Normal.       Left knee: Normal.       Right lower leg: She exhibits tenderness. She exhibits no bony tenderness, no swelling, no edema, no deformity and no laceration.       Legs: Neurological: She is alert and oriented to person, place, and time.  Skin: Skin is warm and dry. No rash noted.  Psychiatric: She has a normal mood and affect.   Treated with clonidine 0.1 mg PO x one  Repeat BP   Assessment & Plan:    There are no diagnoses linked to this encounter. Jacqueline Juarez was seen today for back pain and hypertension.  Diagnoses and all orders for this visit:  Essential hypertension -     cloNIDine (CATAPRES) tablet 0.1 mg; Take 1 tablet (0.1 mg total) by mouth once. -     spironolactone (ALDACTONE) 50 MG tablet; Take 1 tablet (50 mg total) by mouth daily. -     BASIC METABOLIC PANEL WITH GFR; Future  Chronic midline low back pain without sciatica -     methocarbamol (ROBAXIN) 500 MG tablet; Take 1 tablet (500 mg total) by mouth every 8 (eight) hours as needed for muscle spasms. -     naproxen (NAPROSYN) 500 MG tablet; Take 1 tablet (500 mg total) by mouth 2 (two) times daily as needed (back pain). -     Misc. Devices (CANE) MISC; 1 each by Does not apply route daily.   No orders of the defined types were placed in this encounter.   Follow-up: Return in about 3 weeks (around 08/16/2016) for HTN, BP check and labs .   Dessa PhiJosalyn Jequan Shahin MD   Will

## 2016-07-26 NOTE — Patient Instructions (Addendum)
Sherita was seen today for back pain and hypertension.  Diagnoses and all orders for this visit:  Essential hypertension -     cloNIDine (CATAPRES) tablet 0.1 mg; Take 1 tablet (0.1 mg total) by mouth once. -     spironolactone (ALDACTONE) 50 MG tablet; Take 1 tablet (50 mg total) by mouth daily.  Chronic midline low back pain without sciatica -     methocarbamol (ROBAXIN) 500 MG tablet; Take 1 tablet (500 mg total) by mouth every 8 (eight) hours as needed for muscle spasms. -     naproxen (NAPROSYN) 500 MG tablet; Take 1 tablet (500 mg total) by mouth 2 (two) times daily as needed (back pain).   I recommend that you walk with a cane to prevent falls  F/u in 3 weeks for HTN, pharmacy BP check and BMP with GFR (check potassium levels)   F/u with me in 3 months for HTN   Dr. Armen PickupFunches

## 2016-08-10 MED FILL — SPIRONOLACTONE 50 MG TABLET: 50 | 30 days supply | Qty: 30 | Fill #0

## 2016-08-10 MED FILL — METHOCARBAMOL 500 MG TABLET: 500 | 20 days supply | Qty: 60 | Fill #0

## 2016-08-10 MED FILL — NAPROXEN 500 MG TABLET: 500 | 15 days supply | Qty: 30 | Fill #0

## 2016-08-16 ENCOUNTER — Encounter: Payer: Self-pay | Admitting: Family Medicine

## 2016-08-16 ENCOUNTER — Ambulatory Visit: Payer: Medicaid Other | Attending: Family Medicine | Admitting: Family Medicine

## 2016-08-16 VITALS — BP 135/88 | HR 75 | Temp 98.2°F | Ht 64.0 in | Wt 243.0 lb

## 2016-08-16 DIAGNOSIS — Z114 Encounter for screening for human immunodeficiency virus [HIV]: Secondary | ICD-10-CM

## 2016-08-16 DIAGNOSIS — Z23 Encounter for immunization: Secondary | ICD-10-CM | POA: Diagnosis not present

## 2016-08-16 DIAGNOSIS — I10 Essential (primary) hypertension: Secondary | ICD-10-CM | POA: Insufficient documentation

## 2016-08-16 DIAGNOSIS — Z131 Encounter for screening for diabetes mellitus: Secondary | ICD-10-CM | POA: Diagnosis not present

## 2016-08-16 LAB — BASIC METABOLIC PANEL WITH GFR
BUN: 8 mg/dL (ref 7–25)
CALCIUM: 9.4 mg/dL (ref 8.6–10.2)
CO2: 27 mmol/L (ref 20–31)
CREATININE: 0.62 mg/dL (ref 0.50–1.10)
Chloride: 104 mmol/L (ref 98–110)
GFR, Est Non African American: >89 mL/min (ref >=60)
Glucose, Bld: 87 mg/dL (ref 65–99)
Potassium: 3.9 mmol/L (ref 3.5–5.3)
SODIUM: 138 mmol/L (ref 135–146)

## 2016-08-16 LAB — GLUCOSE, POCT (MANUAL RESULT ENTRY): POC Glucose: 83 mg/dl (ref 70–99)

## 2016-08-16 LAB — POCT GLYCOSYLATED HEMOGLOBIN (HGB A1C): Hemoglobin A1C: 4.8

## 2016-08-16 MED ORDER — MULTIVITAMIN ADULT PO TABS: 1.0000 | ORAL_TABLET | Freq: Every day | ORAL | 11 refills | Status: DC

## 2016-08-16 NOTE — Patient Instructions (Addendum)
Jacqueline Juarez was seen today for hypertension.  Diagnoses and all orders for this visit:  Screening for diabetes mellitus (DM) -     HgB A1c -     Glucose (CBG)  Essential hypertension -     BASIC METABOLIC PANEL WITH GFR  Encounter for immunization -     BASIC METABOLIC PANEL WITH GFR  Encounter for screening for HIV -     HIV antibody (with reflex)   F/u in 3 months for HTN  Dr. Armen PickupFunches

## 2016-08-16 NOTE — Assessment & Plan Note (Signed)
Well controlled Compliant Continue aldactone 50 mg daily Checking BMP today

## 2016-08-16 NOTE — Progress Notes (Signed)
Subjective:  Patient ID: Jacqueline Juarez, female    DOB: Mar 30, 1981  Age: 36 y.o. MRN: [id number]  CC: Hypertension   HPI [name] presents for    1. Hypertension: Patient taking Aldactone 25 mg daily.  No polyuria. headache, chest pain, shortness of breath or leg swelling. She does have some cramping in her legs.  2. Diabetes screening: she request screening. No family history of diabetes. She admits to fatigue. No polyuria, polydipsia or weight loss.    Social History  Substance Use Topics  . Smoking status: Never Smoker  . Smokeless tobacco: Never Used  . Alcohol use Yes     Comment: rare    Outpatient Medications Prior to Visit  Medication Sig Dispense Refill  . methocarbamol (ROBAXIN) 500 MG tablet Take 1 tablet (500 mg total) by mouth every 8 (eight) hours as needed for muscle spasms. 60 tablet 2  . naproxen (NAPROSYN) 500 MG tablet Take 1 tablet (500 mg total) by mouth 2 (two) times daily as needed (back pain). 30 tablet 2  . spironolactone (ALDACTONE) 50 MG tablet Take 1 tablet (50 mg total) by mouth daily. 30 tablet 5  . aspirin EC 81 MG tablet Take 1 tablet (81 mg total) by mouth daily. (Patient not taking: Reported on 10/08/2015) 30 tablet 5  . Misc. Devices (CANE) MISC 1 each by Does not apply route daily. (Patient not taking: Reported on 08/16/2016) 1 each 0  . Multiple Vitamin (MULTIVITAMIN WITH MINERALS) TABS Take 1 tablet by mouth every morning.    . terbinafine (LAMISIL) 250 MG tablet Take 250 mg by mouth daily.     No facility-administered medications prior to visit.     ROS Review of Systems  Constitutional: Positive for fatigue. Negative for chills and fever.  Eyes: Negative for visual disturbance.  Respiratory: Negative for shortness of breath.   Cardiovascular: Negative for chest pain.  Gastrointestinal: Negative for abdominal pain and blood in stool.  Endocrine: Negative for polydipsia, polyphagia and polyuria.  Musculoskeletal: Positive  for arthralgias. Negative for back pain.  Skin: Negative for rash.  Allergic/Immunologic: Negative for immunocompromised state.  Hematological: Negative for adenopathy. Does not bruise/bleed easily.  Psychiatric/Behavioral: Negative for dysphoric mood and suicidal ideas.    Objective:  BP 135/88 (BP Location: Left Arm, Patient Position: Sitting, Cuff Size: Large)   Pulse 75   Temp 98.2 F (36.8 C) (Oral)   Ht 5\' 4"  (1.626 m)   Wt 243 lb (110.2 kg)   SpO2 95%   BMI 41.71 kg/m   BP/Weight 08/16/2016 07/26/2016 02/27/2016  Systolic BP 135 146 123  Diastolic BP 88 102 85  Wt. (Lbs) 243 246 232  BMI 41.71 42.23 39.82    Physical Exam  Constitutional: She is oriented to person, place, and time. She appears well-developed and well-nourished. No distress.  HENT:  Head: Normocephalic and atraumatic.  Cardiovascular: Normal rate, regular rhythm, normal heart sounds and intact distal pulses.   Pulmonary/Chest: Effort normal and breath sounds normal.  Musculoskeletal: She exhibits no edema.       Right knee: Normal.       Left knee: Normal.       Right lower leg: She exhibits tenderness. She exhibits no bony tenderness, no swelling, no edema, no deformity and no laceration.       Legs: Neurological: She is alert and oriented to person, place, and time.  Skin: Skin is warm and dry. No rash noted.  Psychiatric: She has a normal  mood and affect.   Lab Results  Component Value Date   HGBA1C 4.8 08/16/2016   CBG 83  Assessment & Plan:   There are no diagnoses linked to this encounter. Bethann was seen today for hypertension.  Diagnoses and all orders for this visit:  Screening for diabetes mellitus (DM) -     HgB A1c -     Glucose (CBG)  Essential hypertension -     BASIC METABOLIC PANEL WITH GFR  Encounter for immunization -     BASIC METABOLIC PANEL WITH GFR  Encounter for screening for HIV -     HIV antibody (with reflex)   No orders of the defined types were placed in  this encounter.   Follow-up: Return in about 3 months (around 11/13/2016) for HTN .   Dessa Phi MD   Will

## 2016-08-16 NOTE — Progress Notes (Signed)
Pt is here to follow up on HTN. Pt would like to get checked for diabetes.

## 2016-08-17 LAB — HIV ANTIBODY (ROUTINE TESTING W REFLEX): HIV 1&2 Ab, 4th Generation: NONREACTIVE

## 2016-08-18 ENCOUNTER — Telehealth: Payer: Self-pay

## 2016-08-18 ENCOUNTER — Telehealth: Payer: Self-pay | Admitting: Family Medicine

## 2016-08-18 NOTE — Telephone Encounter (Signed)
Patient called to speak with nurse regarding lab results. Pt has more questions

## 2016-08-18 NOTE — Telephone Encounter (Signed)
Pt was called and informed of lab results. 

## 2016-08-19 NOTE — Telephone Encounter (Signed)
Patient called requesting results. Advised pt that I would send a note to the nurse and she should get a call back within 24 to 48 hours. Please f/u.

## 2016-08-31 ENCOUNTER — Ambulatory Visit: Payer: Medicaid Other | Admitting: Physical Therapy

## 2016-09-09 ENCOUNTER — Ambulatory Visit: Payer: Medicaid Other | Attending: Family Medicine | Admitting: Physical Therapy

## 2016-09-09 ENCOUNTER — Encounter: Payer: Self-pay | Admitting: Physical Therapy

## 2016-09-09 DIAGNOSIS — G8929 Other chronic pain: Secondary | ICD-10-CM | POA: Diagnosis present

## 2016-09-09 DIAGNOSIS — M6281 Muscle weakness (generalized): Secondary | ICD-10-CM | POA: Diagnosis present

## 2016-09-09 DIAGNOSIS — R262 Difficulty in walking, not elsewhere classified: Secondary | ICD-10-CM | POA: Diagnosis present

## 2016-09-09 DIAGNOSIS — M545 Low back pain: Secondary | ICD-10-CM | POA: Diagnosis not present

## 2016-09-09 NOTE — Patient Instructions (Signed)
   Walk 10 minutes 3x/day

## 2016-09-09 NOTE — Therapy (Signed)
[facility] [address] [facility] [address] [place], [place], [address] Phone: [phone]   Fax:  [phone]  Physical Therapy Evaluation  Patient Details  Name: Jacqueline Juarez MRN: [id number] Date of Birth: [birth year] Referring Provider: Otho Darner, MD  Encounter Date: 09/09/2016      PT End of Session - 09/09/16 1101    Visit Number 1   Authorization Type Medicaid one-time evaluation   PT Start Time 1101   PT Stop Time 1140   PT Time Calculation (min) 39 min   Activity Tolerance Patient tolerated treatment well   Behavior During Therapy [facility] for tasks assessed/performed      Past Medical History:  Diagnosis Date  . Anxiety    At age 19  . Arthritis Dx 2014  . Chronic back pain   . High cholesterol Dx 2014  . Hypertension DX 2014  . Lumbar radiculopathy Dx 2015  . Panic    At age 60  . Paresthesias    left hand, bilat feet  . Peripheral neuropathy (HCC)    left hand    Past Surgical History:  Procedure Laterality Date  . NO PAST SURGERIES      There were no vitals filed for this visit.       Subjective Assessment - 09/09/16 1106    Subjective Pt reports she has back pain, legs jump at night. Tingling in hands and feet. Pt reports buldging disks. Points to cervical-thoracic junction and thoracolumbar junction when describing pain- sharp pain. Pt reports frequent falling due to fatigue legs give out. Does not exercise because it makes her feel weak and legs start feeling tight. Reports laying in bed at home mostly.    Patient Stated Goals be able to do things, walk   Currently in Pain? Yes   Pain Location Back   Pain Orientation Lower;Upper   Pain Descriptors / Indicators Sharp   Pain Relieving Factors lay down, medications            [facility] PT Assessment - 09/09/16 0001      Assessment   Medical Diagnosis thoracolumbar and R hip pain   Referring Provider Otho Darner, MD   Hand Dominance Right   Prior Therapy no     Precautions   Precautions Fall   Precaution Comments pt reports falls due to fatigue     Restrictions   Weight Bearing Restrictions No     Balance Screen   Has the patient fallen in the past 6 months Yes   How many times? 10   Has the patient had a decrease in activity level because of a fear of falling?  Yes   Is the patient reluctant to leave their home because of a fear of falling?  Yes     Home Environment   Living Environment Private residence   Living Arrangements Children;Other relatives     Prior Function   Level of Independence Independent     Cognition   Overall Cognitive Status Within Functional Limits for tasks assessed     Sensation   Additional Comments tingling hands and feet     Posture/Postural Control   Posture Comments slouching posture with increased lordosis at cervical and lumbar region                   [facility] Adult PT Treatment/Exercise - 09/09/16 0001      Exercises   Exercises Other Exercises   Other Exercises  see exercises scanned into "  patient instructions"                 PT Education - 09/09/16 1114    Education provided Yes   Education Details anatomy of condition, POC, HEP, exercise form/rationale, importance of taking breaks   Person(s) Educated Patient   Methods Explanation;Demonstration;Tactile cues;Verbal cues;Handout   Comprehension Verbalized understanding;Returned demonstration;Verbal cues required;Tactile cues required                    Plan - 09/09/16 1253    Clinical Impression Statement Pt presents to PT with complaints of lower cervical and upper lumbar pain as well as leg weakness with giving out. Pt is not active on a regular basis and was educated on importance of regular exercise with appropraite rest breaks to improve strength and endurance. Educated in HEP which pt was able to perform appropriately with difficulty. Pt reported comfort and understanding for d/c. I  provided her with contact info for [facility] should she want further formal PT. She was instructed to contact us with any further questions.    PT Frequency --  one-time medicaid visit   PT Treatment/Interventions Therapeutic exercise;ADLs/Self Care Home Management;Patient/family education   Consulted and Agree with Plan of Care Patient      Patient will benefit from skilled therapeutic intervention in order to improve the following deficits and impairments:  Difficulty walking, Pain, Decreased strength  Visit Diagnosis: Chronic midline low back pain, with sciatica presence unspecified - Plan: PT plan of care cert/re-cert  Difficulty in walking, not elsewhere classified - Plan: PT plan of care cert/re-cert  Muscle weakness (generalized) - Plan: PT plan of care cert/re-cert     Problem List Patient Active Problem List   Diagnosis Date Noted  . Right calf pain 09/25/2015  . Constipation 07/10/2015  . HTN (hypertension) 09/30/2014  . Tinea pedis 09/30/2014  . Onychomycosis of toenail 09/30/2014  . Intertrigo 09/30/2014  . Abnormality of gait 07/02/2014  . Low back pain 06/06/2014  . Paresthesia 06/06/2014    Carita Sollars C. Caresse Sedivy PT, DPT 09/09/16 1:01 PM   [facility] [address] [place], [place], [address] Phone: [phone]   Fax:  [phone]  Name: Jacqueline Juarez MRN: [id number] Date of Birth: Feb 05, 1981

## 2016-10-01 MED FILL — METHOCARBAMOL 500 MG TABLET: 500 | 20 days supply | Qty: 60 | Fill #1

## 2016-10-01 MED FILL — SPIRONOLACTONE 50 MG TABLET: 50 | 30 days supply | Qty: 30 | Fill #1

## 2016-11-03 ENCOUNTER — Encounter: Payer: Self-pay | Admitting: Family Medicine

## 2016-11-24 ENCOUNTER — Ambulatory Visit (INDEPENDENT_AMBULATORY_CARE_PROVIDER_SITE_OTHER): Payer: Medicaid Other | Admitting: Neurology

## 2016-11-24 ENCOUNTER — Encounter: Payer: Self-pay | Admitting: Neurology

## 2016-11-24 VITALS — BP 128/87 | HR 69 | Ht 64.0 in | Wt 238.5 lb

## 2016-11-24 DIAGNOSIS — M545 Low back pain: Secondary | ICD-10-CM

## 2016-11-24 DIAGNOSIS — G2581 Restless legs syndrome: Secondary | ICD-10-CM | POA: Diagnosis not present

## 2016-11-24 DIAGNOSIS — R202 Paresthesia of skin: Secondary | ICD-10-CM

## 2016-11-24 DIAGNOSIS — G8929 Other chronic pain: Secondary | ICD-10-CM

## 2016-11-24 MED ORDER — ROPINIROLE HCL 0.5 MG PO TABS: 1.0000 mg | ORAL_TABLET | Freq: Every day | ORAL | 6 refills | Status: DC

## 2016-11-24 NOTE — Progress Notes (Signed)
PATIENT: Jacqueline Juarez DOB: 1981-03-15  REASON FOR VISIT: follow up- numbness in extremities, low back pain Primary care physician:Funches, Josalyn, MD  HISTORY OF PRESENT ILLNESS: I saw Jacqueline Juarez in 2016 for evaluation of numbness in left foot, left hand, low back pain, presented to the emergency room,  She had a past medical history of depression anxiety, is on disability due to her mood disorder, lives with her teenager children, she does not drive.  She has a history of chronic low back pain, she presented to emergency room in November 20 first 2015, with constellation of complains, numbness in her left hand, left foot, intermittent, worsening low back pain, was evaluated by neuro hospitalist Dr. Hosie PoissonSumner,  MRI of the brain, and cervical spine with without contrast showed no significant abnormality Lab, normal CBC, CMP  We have initiated more evaluation, MRI of the lumbar and thoracic spine in 2016. MRI of the lumbar spine showed a mild herniated disc at L5-S1 level with no significant stenosis .  She was given treatment of gabapentin, NSAIDs, reported gabapentin has somewhat helpful, last clinical visit was in August 2016.  UPDATE November 24 2016: Today, she complains of bilateral leg numbness, tingling, tightness, jumps, she has difficulty sleeping, difficulty holding still. 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    ALLERGIES: Allergies  Allergen Reactions  . Penicillins Hives    Has patient had a PCN reaction causing immediate rash, facial/tongue/throat swelling, SOB or lightheadedness with hypotension: No Has patient had a PCN reaction causing severe rash involving mucus membranes or skin necrosis: No Has patient had a PCN reaction that required hospitalization No Has patient had a PCN reaction occurring within the last 10 years: No If all of the above answers are "NO", then may proceed with Cephalosporin use.    HOME MEDICATIONS: Outpatient Medications Prior to Visit  Medication Sig Dispense Refill  . methocarbamol (ROBAXIN) 500 MG tablet Take 1 tablet (500 mg total) by mouth every 8 (eight) hours as needed for muscle spasms. 60 tablet 2  . Multiple Vitamin (MULTIVITAMIN WITH MINERALS) TABS Take 1 tablet by mouth every morning.    . Multiple Vitamins-Minerals (MULTIVITAMIN ADULT) TABS Take 1 tablet by mouth daily. 30 tablet 11  . naproxen (NAPROSYN) 500 MG tablet Take 1 tablet (500 mg total) by mouth 2 (two) times daily as needed (back pain). 30 tablet 2  . spironolactone (ALDACTONE) 50 MG tablet Take 1 tablet (50 mg total) by mouth daily. 30 tablet 5  . terbinafine (LAMISIL) 250 MG tablet Take 250 mg by mouth daily.     No facility-administered medications prior to visit.     PAST MEDICAL HISTORY: Past Medical History:  Diagnosis Date  . Anxiety    At age 36  .  Arthritis Dx 2014  . Chronic back pain   . High cholesterol Dx 2014  . Hypertension DX 2014  . Lumbar radiculopathy Dx 2015  . Panic    At age 20  . Paresthesias    left hand, bilat feet  . Peripheral neuropathy    left hand    PAST SURGICAL HISTORY: Past Surgical History:  Procedure Laterality Date  . NO PAST SURGERIES      FAMILY HISTORY: Family History    Problem Relation Age of Onset  . Arthritis Mother   . Arthritis Father     SOCIAL HISTORY: Social History   Social History  . Marital status: Single    Spouse name: N/A  . Number of children: 2  . Years of education: 9th   Occupational History  .      disabled   Social History Main Topics  . Smoking status: Never Smoker  . Smokeless tobacco: Never Used  . Alcohol use Yes     Comment: rare  . Drug use: No  . Sexual activity: Not on file   Other Topics Concern  . Not on file   Social History Narrative   Patient is single and lives at home with her children.    Patient is disabled.   Education 9 th grade.   Right handed.   Caffeine sweet tea two bottles daily.      PHYSICAL EXAM  Vitals:   11/24/16 1059  BP: 128/87  Pulse: 69  Weight: 238 lb 8 oz (108.2 kg)  Height: 5\' 4"  (1.626 m)   Body mass index is 40.94 kg/m.  PHYSICAL EXAMNIATION:  Gen: NAD, conversant, well nourised, obese, well groomed                     Cardiovascular: Regular rate rhythm, no peripheral edema, warm, nontender. Eyes: Conjunctivae clear without exudates or hemorrhage Neck: Supple, no carotid bruits. Pulmonary: Clear to auscultation bilaterally   NEUROLOGICAL EXAM:  MENTAL STATUS: Speech:    Speech is normal; fluent and spontaneous with normal comprehension.  Cognition:     Orientation to time, place and person     Normal recent and remote memory     Normal Attention span and concentration     Normal Language, naming, repeating,spontaneous speech     Fund of knowledge   CRANIAL NERVES: CN II: Visual fields are full to confrontation. Fundoscopic exam is normal with sharp discs and no vascular changes. Pupils are round equal and briskly reactive to light. CN III, IV, VI: extraocular movement are normal. No ptosis. CN V: Facial sensation is intact to pinprick in all 3 divisions bilaterally. Corneal responses are intact.  CN VII: Face is symmetric with normal eye closure  and smile. CN VIII: Hearing is normal to rubbing fingers CN IX, X: Palate elevates symmetrically. Phonation is normal. CN XI: Head turning and shoulder shrug are intact CN XII: Tongue is midline with normal movements and no atrophy.  MOTOR: There is no pronator drift of out-stretched arms. Muscle bulk and tone are normal. Muscle strength is normal.  REFLEXES: Reflexes are 2+ and symmetric at the biceps, triceps, knees, and ankles. Plantar responses are flexor.  SENSORY: Intact to light touch, pinprick, positional and vibratory sensation are intact in fingers and toes.  COORDINATION: Rapid alternating movements and fine finger movements are intact. There is no dysmetria on finger-to-nose and heel-knee-shin.    GAIT/STANCE: Posture is normal. Gait is steady with normal steps, base, arm  swing, and turning. Heel and toe walking are normal. Tandem gait is normal.  Romberg is absent.   DIAGNOSTIC DATA (LABS, IMAGING, TESTING) - I reviewed patient records, labs, notes, testing and imaging myself where available.      Component Value Date/Time   NA 138 08/16/2016 1054   K 3.9 08/16/2016 1054   CL 104 08/16/2016 1054   CO2 27 08/16/2016 1054   GLUCOSE 87 08/16/2016 1054   BUN 8 08/16/2016 1054   CREATININE 0.62 08/16/2016 1054   CALCIUM 9.4 08/16/2016 1054   PROT 7.1 02/27/2016 1152   ALBUMIN 4.0 02/27/2016 1152   AST 16 02/27/2016 1152   ALT 21 02/27/2016 1152   ALKPHOS 64 02/27/2016 1152   BILITOT 1.0 02/27/2016 1152   GFRNONAA >89 08/16/2016 1054   GFRAA >89 08/16/2016 1054      ASSESSMENT AND PLAN 36 y.o. year old   Chronic low back pain Bilateral lower extremity paresthesia  Her symptoms is suggestive of restless leg syndrome, she is a poor historian,  Laboratory evaluation including ferritin level  Try low dose of Requip 0.5 milligrams titrating to 1 mg every night  Return to clinic in 3 months, other options are gabapentin, Lyrica   Levert Feinstein, M.D.  Ph.D.  [facility] [address] [place], [place] [address] Phone: [phone] Fax:      [phone]

## 2016-11-26 ENCOUNTER — Telehealth: Payer: Self-pay | Admitting: Neurology

## 2016-11-26 LAB — COPPER, SERUM: Copper: 106 ug/dL (ref 72–166)

## 2016-11-26 LAB — IRON AND TIBC
IRON SATURATION: 15 % (ref 15–55)
IRON: 68 ug/dL (ref 27–159)
TIBC: 468 ug/dL — AB (ref 250–450)
UIBC: 400 ug/dL (ref 131–425)

## 2016-11-26 LAB — TSH: TSH: 1.33 u[IU]/mL (ref 0.450–4.500)

## 2016-11-26 LAB — ANA W/REFLEX IF POSITIVE: ANA: NEGATIVE

## 2016-11-26 LAB — SEDIMENTATION RATE: SED RATE: 2 mm/h (ref 0–32)

## 2016-11-26 LAB — VITAMIN B12: Vitamin B-12: 312 pg/mL (ref 232–1245)

## 2016-11-26 LAB — HIV ANTIBODY (ROUTINE TESTING W REFLEX): HIV Screen 4th Generation wRfx: NONREACTIVE

## 2016-11-26 LAB — VITAMIN D 25 HYDROXY (VIT D DEFICIENCY, FRACTURES): VIT D 25 HYDROXY: 11.9 ng/mL — AB (ref 30.0–100.0)

## 2016-11-26 LAB — FERRITIN: Ferritin: 23 ng/mL (ref 15–150)

## 2016-11-26 LAB — C-REACTIVE PROTEIN: CRP: 0.3 mg/L (ref 0.0–4.9)

## 2016-11-26 LAB — RPR: RPR Ser Ql: NONREACTIVE

## 2016-11-26 NOTE — Telephone Encounter (Signed)
Please call patient, laboratory evaluation showed low vitamin D 11, she should take over-the-counter vitamin D3 supplement 2000 units daily.  Ferritin level was within the low normal range 23, she would benefit over-the-counter iron supplements

## 2016-11-26 NOTE — Telephone Encounter (Signed)
Called and LVM for pt to call about lab results. Gave [facility] phone number.  

## 2016-11-29 NOTE — Telephone Encounter (Signed)
Pt returned RN's call °

## 2016-11-29 NOTE — Telephone Encounter (Signed)
Patient called office returning RN's call for lab results.  Please call

## 2016-11-29 NOTE — Telephone Encounter (Signed)
IF patient calls back please contact me we have to give her recommendations on lab work. Left vm again for patient.

## 2016-11-29 NOTE — Telephone Encounter (Signed)
Left vm for patient to call back about lab work and recommendations.

## 2016-11-30 NOTE — Telephone Encounter (Signed)
Rn spoke with patient about her lab work. Rn stated he vitamin d3 was low and she is to take 2000 otc daily. Pt verbalized understanding.  Rn also stated her ferritin level was low normal range 23. She would need to take a otc iron supplement. Pt verbalized understanding.

## 2016-11-30 NOTE — Telephone Encounter (Signed)
Pt called back- msg relayed per RN, pt said she understood the message.

## 2017-01-20 ENCOUNTER — Ambulatory Visit: Payer: Medicaid Other | Admitting: Family Medicine

## 2017-02-02 ENCOUNTER — Ambulatory Visit: Payer: Medicaid Other | Admitting: Family Medicine

## 2017-02-14 MED FILL — ?SPIRONOLACTONE 50 MG TABLE: 50 | 30 days supply | Qty: 30 | Fill #2

## 2017-03-15 ENCOUNTER — Ambulatory Visit: Payer: Medicaid Other | Admitting: Adult Health

## 2017-03-16 ENCOUNTER — Encounter: Payer: Self-pay | Admitting: Adult Health

## 2017-03-23 ENCOUNTER — Encounter: Payer: Self-pay | Admitting: Family Medicine

## 2017-03-23 ENCOUNTER — Ambulatory Visit: Payer: Medicaid Other | Attending: Family Medicine | Admitting: Family Medicine

## 2017-03-23 VITALS — BP 135/83 | HR 83 | Temp 98.6°F | Ht 64.0 in | Wt 241.0 lb

## 2017-03-23 DIAGNOSIS — G629 Polyneuropathy, unspecified: Secondary | ICD-10-CM | POA: Insufficient documentation

## 2017-03-23 DIAGNOSIS — I10 Essential (primary) hypertension: Secondary | ICD-10-CM | POA: Insufficient documentation

## 2017-03-23 DIAGNOSIS — E669 Obesity, unspecified: Secondary | ICD-10-CM | POA: Diagnosis not present

## 2017-03-23 DIAGNOSIS — G2581 Restless legs syndrome: Secondary | ICD-10-CM | POA: Diagnosis not present

## 2017-03-23 DIAGNOSIS — Z23 Encounter for immunization: Secondary | ICD-10-CM | POA: Diagnosis not present

## 2017-03-23 DIAGNOSIS — Z880 Allergy status to penicillin: Secondary | ICD-10-CM | POA: Insufficient documentation

## 2017-03-23 DIAGNOSIS — Z6841 Body Mass Index (BMI) 40.0 and over, adult: Secondary | ICD-10-CM | POA: Insufficient documentation

## 2017-03-23 DIAGNOSIS — E559 Vitamin D deficiency, unspecified: Secondary | ICD-10-CM | POA: Diagnosis not present

## 2017-03-23 DIAGNOSIS — M5416 Radiculopathy, lumbar region: Secondary | ICD-10-CM | POA: Diagnosis not present

## 2017-03-23 DIAGNOSIS — E7800 Pure hypercholesterolemia, unspecified: Secondary | ICD-10-CM | POA: Insufficient documentation

## 2017-03-23 MED ORDER — DULOXETINE HCL 60 MG PO CPEP: 60.0000 mg | ORAL_CAPSULE | Freq: Every day | ORAL | 3 refills | Status: DC

## 2017-03-23 MED ORDER — SPIRONOLACTONE 50 MG PO TABS: 50.0000 mg | ORAL_TABLET | Freq: Every day | ORAL | 5 refills | Status: DC

## 2017-03-23 MED ORDER — BACLOFEN 10 MG PO TABS: 10.0000 mg | ORAL_TABLET | Freq: Three times a day (TID) | ORAL | 3 refills | Status: DC

## 2017-03-23 MED FILL — BACLOFEN 10 MG TABLET: 10 | 30 days supply | Qty: 90 | Fill #0

## 2017-03-23 MED FILL — ?DULOXETINE HCL DR 60 MG CA: 60 MG | 30 days supply | Qty: 30 | Fill #0

## 2017-03-23 MED FILL — SPIRONOLACTONE 50 MG TABLET: 50 | 30 days supply | Qty: 30 | Fill #0

## 2017-03-23 NOTE — Patient Instructions (Signed)

## 2017-03-23 NOTE — Progress Notes (Signed)
Subjective:  Patient ID: Jacqueline Juarez, female    DOB: 01/24/1981  Age: 36 y.o. MRN: [id number]  CC: Back Pain and Leg Pain   HPI Jacqueline Juarez is a 36 year old female with a history of hypertension, obesity, restless leg syndrome, low back pain who presents today to establish care with me. She was previously followed by Dr. Adrian Blackwater.  She complains of low back pain which sometimes radiates down both lower extremities and is not controlled on naproxen. She had a fall one week ago as a result of pain; informs me she does have gabapentin at home which she has been taking with no relief in symptoms. Denies numbness in her extremities or loss of sphincteric function. She does not ambulate with a cane. She also points to her thoracic spine as another source of pain. Her last MRI from 06/2014 revealed bulging disc at L5-S1 with facet hypertrophy.  She also complains of her legs jumping; she is currently followed by Neurology and review of her chart indicates she should be on Requip which she admits to not taking.  She is doing well on antihypertensive and denies side effects from it.  Past Medical History:  Diagnosis Date  . Anxiety    At age 75  . Arthritis Dx 2014  . Chronic back pain   . High cholesterol Dx 2014  . Hypertension DX 2014  . Lumbar radiculopathy Dx 2015  . Panic    At age 22  . Paresthesias    left hand, bilat feet  . Peripheral neuropathy    left hand    Past Surgical History:  Procedure Laterality Date  . NO PAST SURGERIES      Allergies  Allergen Reactions  . Penicillins Hives    Has patient had a PCN reaction causing immediate rash, facial/tongue/throat swelling, SOB or lightheadedness with hypotension: No Has patient had a PCN reaction causing severe rash involving mucus membranes or skin necrosis: No Has patient had a PCN reaction that required hospitalization No Has patient had a PCN reaction occurring within the last 10 years: No If all of  the above answers are "NO", then may proceed with Cephalosporin use.     Outpatient Medications Prior to Visit  Medication Sig Dispense Refill  . Multiple Vitamin (MULTIVITAMIN WITH MINERALS) TABS Take 1 tablet by mouth every morning.    . Multiple Vitamins-Minerals (MULTIVITAMIN ADULT) TABS Take 1 tablet by mouth daily. 30 tablet 11  . naproxen (NAPROSYN) 500 MG tablet Take 1 tablet (500 mg total) by mouth 2 (two) times daily as needed (back pain). 30 tablet 2  . terbinafine (LAMISIL) 250 MG tablet Take 250 mg by mouth daily.    . methocarbamol (ROBAXIN) 500 MG tablet Take 1 tablet (500 mg total) by mouth every 8 (eight) hours as needed for muscle spasms. 60 tablet 2  . spironolactone (ALDACTONE) 50 MG tablet Take 1 tablet (50 mg total) by mouth daily. 30 tablet 5  . rOPINIRole (REQUIP) 0.5 MG tablet Take 2 tablets (1 mg total) by mouth at bedtime. (Patient not taking: Reported on 03/23/2017) 60 tablet 6   No facility-administered medications prior to visit.     ROS Review of Systems  Constitutional: Negative for activity change, appetite change and fatigue.  HENT: Negative for congestion, sinus pressure and sore throat.   Eyes: Negative for visual disturbance.  Respiratory: Negative for cough, chest tightness, shortness of breath and wheezing.   Cardiovascular: Negative for chest pain and palpitations.  Gastrointestinal:  Negative for abdominal distention, abdominal pain and constipation.  Endocrine: Negative for polydipsia.  Genitourinary: Negative for dysuria and frequency.  Musculoskeletal: Positive for back pain. Negative for arthralgias.  Skin: Negative for rash.  Neurological: Negative for tremors, light-headedness and numbness.  Hematological: Does not bruise/bleed easily.  Psychiatric/Behavioral: Negative for agitation and behavioral problems.    Objective:  BP 135/83   Pulse 83   Temp 98.6 F (37 C) (Oral)   Ht 5' 4"  (1.626 m)   Wt 241 lb (109.3 kg)   SpO2 99%   BMI  41.37 kg/m   BP/Weight 03/23/2017 11/24/2016 5/37/4827  Systolic BP 078 675 449  Diastolic BP 83 87 88  Wt. (Lbs) 241 238.5 243  BMI 41.37 40.94 41.71      Physical Exam  Constitutional: She is oriented to person, place, and time. She appears well-developed and well-nourished.  Cardiovascular: Normal rate, normal heart sounds and intact distal pulses.   No murmur heard. Pulmonary/Chest: Effort normal and breath sounds normal. She has no wheezes. She has no rales. She exhibits no tenderness.  Abdominal: Soft. Bowel sounds are normal. She exhibits no distension and no mass. There is no tenderness.  Musculoskeletal: Normal range of motion. She exhibits tenderness (slight lumbar spine tenderness; negative straight leg raise bilaterally; negative straight leg raise bilaterally).  Neurological: She is alert and oriented to person, place, and time. She has normal reflexes.     Assessment & Plan:   1. Essential hypertension Controlled - spironolactone (ALDACTONE) 50 MG tablet; Take 1 tablet (50 mg total) by mouth daily.  Dispense: 30 tablet; Refill: 5 - CMP14+EGFR; Future - Lipid panel; Future  2. Need for influenza vaccination - Flu Vaccine QUAD 36+ mos IM  3. Lumbar radiculopathy Uncontrolled She states she has gabapentin at home - unsure of dose She would need to bring this in at her next visit in the event that up titration of doses indicated. Cymbalta added to her regimen - baclofen (LIORESAL) 10 MG tablet; Take 1 tablet (10 mg total) by mouth 3 (three) times daily.  Dispense: 90 tablet; Refill: 3 - DULoxetine (CYMBALTA) 60 MG capsule; Take 1 capsule (60 mg total) by mouth daily.  Dispense: 30 capsule; Refill: 3  4. Restless leg She was prescribed Requip by neurology which she has not been taking Advised to pickup from pharmacy  5. Vitamin D deficiency Previous history of vitamin D deficiency - Vitamin D, 25-hydroxy; Future   Meds ordered this encounter  Medications  .  spironolactone (ALDACTONE) 50 MG tablet    Sig: Take 1 tablet (50 mg total) by mouth daily.    Dispense:  30 tablet    Refill:  5  . baclofen (LIORESAL) 10 MG tablet    Sig: Take 1 tablet (10 mg total) by mouth 3 (three) times daily.    Dispense:  90 tablet    Refill:  3  . DULoxetine (CYMBALTA) 60 MG capsule    Sig: Take 1 capsule (60 mg total) by mouth daily.    Dispense:  30 capsule    Refill:  3    Follow-up: Return in about 3 months (around 06/23/2017) for Follow-up on chronic medical conditions.   Arnoldo Morale MD

## 2017-04-01 ENCOUNTER — Other Ambulatory Visit: Payer: Medicaid Other

## 2017-04-04 ENCOUNTER — Ambulatory Visit: Payer: Medicaid Other | Admitting: Adult Health

## 2017-04-05 ENCOUNTER — Encounter: Payer: Self-pay | Admitting: Adult Health

## 2017-04-15 ENCOUNTER — Other Ambulatory Visit: Payer: Medicaid Other

## 2017-05-06 ENCOUNTER — Encounter: Payer: Self-pay | Admitting: Family Medicine

## 2017-05-06 ENCOUNTER — Ambulatory Visit: Payer: Medicaid Other | Attending: Family Medicine | Admitting: Family Medicine

## 2017-05-06 VITALS — BP 132/87 | HR 65 | Temp 98.5°F | Resp 16 | Ht 60.0 in | Wt 244.2 lb

## 2017-05-06 DIAGNOSIS — L240 Irritant contact dermatitis due to detergents: Secondary | ICD-10-CM

## 2017-05-06 MED ORDER — TRIAMCINOLONE ACETONIDE 0.025 % EX OINT: 1.0000 "application " | TOPICAL_OINTMENT | Freq: Two times a day (BID) | CUTANEOUS | 0 refills | Status: DC | PRN

## 2017-05-06 MED ORDER — HYDROXYZINE HCL 25 MG PO TABS: 25.0000 mg | ORAL_TABLET | Freq: Three times a day (TID) | ORAL | 0 refills | Status: DC | PRN

## 2017-05-06 MED FILL — TRIAMCINOLONE 0.025% OINT: 0.025 | 14 days supply | Qty: 30 | Fill #0

## 2017-05-06 MED FILL — hydrOXYzine HCL 25 MG TABS: 25 | 10 days supply | Qty: 30 | Fill #0

## 2017-05-06 NOTE — Patient Instructions (Signed)
Contact Dermatitis Dermatitis is redness, soreness, and swelling (inflammation) of the skin. Contact dermatitis is a reaction to certain substances that touch the skin. You either touched something that irritated your skin, or you have allergies to something you touched. Follow these instructions at home: Skin Care  Moisturize your skin as needed.  Apply cool compresses to the affected areas.  Try taking a bath with: ? Epsom salts. Follow the instructions on the package. You can get these at a pharmacy or grocery store. ? Baking soda. Pour a small amount into the bath as told by your doctor. ? Colloidal oatmeal. Follow the instructions on the package. You can get this at a pharmacy or grocery store.  Try applying baking soda paste to your skin. Stir water into baking soda until it looks like paste.  Do not scratch your skin.  Bathe less often.  Bathe in lukewarm water. Avoid using hot water. Medicines  Take or apply over-the-counter and prescription medicines only as told by your doctor.  If you were prescribed an antibiotic medicine, take or apply your antibiotic as told by your doctor. Do not stop taking the antibiotic even if your condition starts to get better. General instructions  Keep all follow-up visits as told by your doctor. This is important.  Avoid the substance that caused your reaction. If you do not know what caused it, keep a journal to try to track what caused it. Write down: ? What you eat. ? What cosmetic products you use. ? What you drink. ? What you wear in the affected area. This includes jewelry.  If you were given a bandage (dressing), take care of it as told by your doctor. This includes when to change and remove it. Contact a doctor if:  You do not get better with treatment.  Your condition gets worse.  You have signs of infection such as: ? Swelling. ? Tenderness. ? Redness. ? Soreness. ? Warmth.  You have a fever.  You have new  symptoms. Get help right away if:  You have a very bad headache.  You have neck pain.  Your neck is stiff.  You throw up (vomit).  You feel very sleepy.  You see red streaks coming from the affected area.  Your bone or joint underneath the affected area becomes painful after the skin has healed.  The affected area turns darker.  You have trouble breathing. This information is not intended to replace advice given to you by your health care provider. Make sure you discuss any questions you have with your health care provider. Document Released: 04/04/2009 Document Revised: 11/13/2015 Document Reviewed: 10/23/2014 Elsevier Interactive Patient Education  2018 [employer]  

## 2017-05-06 NOTE — Progress Notes (Signed)
Patient Triage Assessment Form Todays Date: 05/06/17 Name: Jacqueline ReamerDimtrea Juarez  DOB: [birth year] Reason for walkin: Itching When did your symptoms start? Yesterday  Please list symptoms: rash on arms and legs Are you having pain: no  Assessment Rash visible on arms and legs. No facial or throat swelling.  Redness on arms and legs Denies SOB or need to clear throat while speaking.  Pt has tried new laundry detergent and soap.  Has not tried OTC Benadryl or allergy relief.  Advised to wash linen and clothes in detergent that is unscented.   Vital Signs:  T: 98.5  P: 65  R: 16  SpO2: 99  BP: 132/87   Plan: Pt will see provider today while in office.

## 2017-05-07 NOTE — Progress Notes (Signed)
   Subjective:  Patient ID: Jacqueline Juarez, female    DOB: Feb 15, 1981  Age: 36 y.o. MRN: [id number]  CC: Rash   HPI Jacqueline Juarez presents for skin complaint.  Onset yesterday.  Associated symptoms include pruritus, rash.  Location lower legs and  forearms.  She denies any fever; abdominal complaints; new contacts with plants, animals, or individuals with same skin complaints.  She does report using new laundry detergent and soap.  She has tried over-the-counter anti-itch cream and bathing with vinegar/baking soda with minimal relief of symptoms.  Outpatient Medications Prior to Visit  Medication Sig Dispense Refill  . baclofen (LIORESAL) 10 MG tablet Take 1 tablet (10 mg total) by mouth 3 (three) times daily. 90 tablet 3  . DULoxetine (CYMBALTA) 60 MG capsule Take 1 capsule (60 mg total) by mouth daily. 30 capsule 3  . Multiple Vitamin (MULTIVITAMIN WITH MINERALS) TABS Take 1 tablet by mouth every morning.    . Multiple Vitamins-Minerals (MULTIVITAMIN ADULT) TABS Take 1 tablet by mouth daily. 30 tablet 11  . naproxen (NAPROSYN) 500 MG tablet Take 1 tablet (500 mg total) by mouth 2 (two) times daily as needed (back pain). 30 tablet 2  . rOPINIRole (REQUIP) 0.5 MG tablet Take 2 tablets (1 mg total) by mouth at bedtime. (Patient not taking: Reported on 03/23/2017) 60 tablet 6  . spironolactone (ALDACTONE) 50 MG tablet Take 1 tablet (50 mg total) by mouth daily. 30 tablet 5  . terbinafine (LAMISIL) 250 MG tablet Take 250 mg by mouth daily.     No facility-administered medications prior to visit.     ROS Review of Systems  Constitutional: Negative.   Respiratory: Negative.   Cardiovascular: Negative.   Gastrointestinal: Negative.   Skin: Positive for rash.       Skin pruritus.    Objective:  BP 132/87 (BP Location: Left Arm, Patient Position: Sitting, Cuff Size: Large)   Pulse 65   Temp 98.5 F (36.9 C) (Oral)   Resp 16   Ht 5' (1.524 m)   Wt 244 lb 3.2 oz (110.8 kg)    SpO2 99%   BMI 47.69 kg/m   BP/Weight 05/06/2017 03/23/2017 11/24/2016  Systolic BP 132 135 128  Diastolic BP 87 83 87  Wt. (Lbs) 244.2 241 238.5  BMI 47.69 41.37 40.94     Physical Exam  Constitutional: She appears well-developed and well-nourished.  Cardiovascular: Normal rate, regular rhythm, normal heart sounds and intact distal pulses.  Pulmonary/Chest: Effort normal and breath sounds normal.  Abdominal: Soft. Bowel sounds are normal.  Skin: Skin is warm and dry. Rash noted. Rash is maculopapular (fine, generalized to bilateral forearm and bilateral lower legs.  No erythema or swelling present.).  Nursing note and vitals reviewed.    Assessment & Plan:   1. Irritant contact dermatitis due to detergent Recommended discontinuing new laundry detergent and soap. Follow-up if symptoms worsen or fail to improve. - triamcinolone (KENALOG) 0.025 % ointment; Apply 1 application 2 (two) times daily as needed topically. Apply to affected areas for 14 days.  Dispense: 30 g; Refill: 0 - hydrOXYzine (ATARAX/VISTARIL) 25 MG tablet; Take 1 tablet (25 mg total) every 8 (eight) hours as needed by mouth for itching.  Dispense: 30 tablet; Refill: 0     Follow-up: Return if symptoms worsen or fail to improve.   Lizbeth BarkMandesia R Yeudiel Mateo FNP

## 2017-05-24 MED FILL — SPIRONOLACTONE 50 MG TABLET: 50 | 30 days supply | Qty: 30 | Fill #1

## 2017-07-27 ENCOUNTER — Ambulatory Visit: Payer: Medicaid Other | Admitting: Family Medicine

## 2017-08-12 ENCOUNTER — Encounter: Payer: Self-pay | Admitting: Family Medicine

## 2017-08-12 ENCOUNTER — Telehealth: Payer: Self-pay | Admitting: Family Medicine

## 2017-08-12 ENCOUNTER — Ambulatory Visit: Payer: Medicaid Other | Attending: Family Medicine | Admitting: Family Medicine

## 2017-08-12 VITALS — BP 126/83 | HR 83 | Temp 98.3°F | Ht 60.0 in | Wt 232.8 lb

## 2017-08-12 DIAGNOSIS — R600 Localized edema: Secondary | ICD-10-CM | POA: Insufficient documentation

## 2017-08-12 DIAGNOSIS — F419 Anxiety disorder, unspecified: Secondary | ICD-10-CM | POA: Diagnosis not present

## 2017-08-12 DIAGNOSIS — Z880 Allergy status to penicillin: Secondary | ICD-10-CM | POA: Insufficient documentation

## 2017-08-12 DIAGNOSIS — I10 Essential (primary) hypertension: Secondary | ICD-10-CM | POA: Insufficient documentation

## 2017-08-12 DIAGNOSIS — Z79899 Other long term (current) drug therapy: Secondary | ICD-10-CM | POA: Diagnosis not present

## 2017-08-12 DIAGNOSIS — M5416 Radiculopathy, lumbar region: Secondary | ICD-10-CM

## 2017-08-12 DIAGNOSIS — R202 Paresthesia of skin: Secondary | ICD-10-CM | POA: Insufficient documentation

## 2017-08-12 MED ORDER — SPIRONOLACTONE 50 MG PO TABS: 50.0000 mg | ORAL_TABLET | Freq: Every day | ORAL | 3 refills | Status: DC

## 2017-08-12 MED ORDER — FUROSEMIDE 20 MG PO TABS: 20.0000 mg | ORAL_TABLET | Freq: Every day | ORAL | 0 refills | Status: DC

## 2017-08-12 MED ORDER — DULOXETINE HCL 60 MG PO CPEP: 60.0000 mg | ORAL_CAPSULE | Freq: Every day | ORAL | 3 refills | Status: DC

## 2017-08-12 MED ORDER — BACLOFEN 10 MG PO TABS: 10.0000 mg | ORAL_TABLET | Freq: Three times a day (TID) | ORAL | 3 refills | Status: DC

## 2017-08-12 MED FILL — ?DULoxetine HCL 60MG CPEP: 60 | 30 days supply | Qty: 30 | Fill #0

## 2017-08-12 MED FILL — ?SPIRONOLACTONE 50 MG TABLE: 50 | 30 days supply | Qty: 30 | Fill #0

## 2017-08-12 MED FILL — ?FUROSEMIDE 20 MG TABLET: 20 | 7 days supply | Qty: 7 | Fill #0

## 2017-08-12 NOTE — Progress Notes (Signed)
Subjective:  Patient ID: Jacqueline Juarez, female    DOB: August 18, 1980  Age: 37 y.o. MRN: [id number]  CC: Hypertension   HPI Jacqueline Juarez is a 37 year old female with a history of hypertension, obesity, restless leg syndrome, low back pain who presents today for follow-up visit.  She complains of swelling of both lower extremities, pain, tingling for the last couple of months and informs me "neurology cannot find anything wrong".  She has no known history of diabetes mellitus. Pedal edema is not dependent and she states it occurs all the time. Denies shortness of breath, weight gain or chest pain.  She also has low back pain which sometimes radiates down her lower extremities which is rated at a 5/10 at this time.  Denies loss of sphincteric function. MRI from 07/2014 revealed L5-S1 bulging disc and facet hypertrophy, mild biforaminal stenosis  She has been compliant with her antihypertensive and is requesting a refill of her medications today.  Past Medical History:  Diagnosis Date  . Anxiety    At age 72  . Arthritis Dx 2014  . Chronic back pain   . High cholesterol Dx 2014  . Hypertension DX 2014  . Lumbar radiculopathy Dx 2015  . Panic    At age 21  . Paresthesias    left hand, bilat feet  . Peripheral neuropathy    left hand    Past Surgical History:  Procedure Laterality Date  . NO PAST SURGERIES      Allergies  Allergen Reactions  . Penicillins Hives    Has patient had a PCN reaction causing immediate rash, facial/tongue/throat swelling, SOB or lightheadedness with hypotension: No Has patient had a PCN reaction causing severe rash involving mucus membranes or skin necrosis: No Has patient had a PCN reaction that required hospitalization No Has patient had a PCN reaction occurring within the last 10 years: No If all of the above answers are "NO", then may proceed with Cephalosporin use.     Outpatient Medications Prior to Visit  Medication Sig Dispense  Refill  . Multiple Vitamin (MULTIVITAMIN WITH MINERALS) TABS Take 1 tablet by mouth every morning.    . Multiple Vitamins-Minerals (MULTIVITAMIN ADULT) TABS Take 1 tablet by mouth daily. 30 tablet 11  . rOPINIRole (REQUIP) 0.5 MG tablet Take 2 tablets (1 mg total) by mouth at bedtime. 60 tablet 6  . baclofen (LIORESAL) 10 MG tablet Take 1 tablet (10 mg total) by mouth 3 (three) times daily. 90 tablet 3  . DULoxetine (CYMBALTA) 60 MG capsule Take 1 capsule (60 mg total) by mouth daily. 30 capsule 3  . spironolactone (ALDACTONE) 50 MG tablet Take 1 tablet (50 mg total) by mouth daily. 30 tablet 5  . hydrOXYzine (ATARAX/VISTARIL) 25 MG tablet Take 1 tablet (25 mg total) every 8 (eight) hours as needed by mouth for itching. (Patient not taking: Reported on 08/12/2017) 30 tablet 0  . naproxen (NAPROSYN) 500 MG tablet Take 1 tablet (500 mg total) by mouth 2 (two) times daily as needed (back pain). (Patient not taking: Reported on 08/12/2017) 30 tablet 2  . terbinafine (LAMISIL) 250 MG tablet Take 250 mg by mouth daily.    Marland Kitchen triamcinolone (KENALOG) 0.025 % ointment Apply 1 application 2 (two) times daily as needed topically. Apply to affected areas for 14 days. (Patient not taking: Reported on 08/12/2017) 30 g 0   No facility-administered medications prior to visit.     ROS Review of Systems  Constitutional: Negative for activity  change, appetite change and fatigue.  HENT: Negative for congestion, sinus pressure and sore throat.   Eyes: Negative for visual disturbance.  Respiratory: Negative for cough, chest tightness, shortness of breath and wheezing.   Cardiovascular: Positive for leg swelling. Negative for chest pain and palpitations.  Gastrointestinal: Negative for abdominal distention, abdominal pain and constipation.  Endocrine: Negative for polydipsia.  Genitourinary: Negative for dysuria and frequency.  Musculoskeletal: Positive for back pain. Negative for arthralgias.  Skin: Negative for  rash.  Neurological: Positive for numbness. Negative for tremors and light-headedness.  Hematological: Does not bruise/bleed easily.  Psychiatric/Behavioral: Negative for agitation and behavioral problems.    Objective:  BP 126/83   Pulse 83   Temp 98.3 F (36.8 C) (Oral)   Ht 5' (1.524 m)   Wt 232 lb 12.8 oz (105.6 kg)   SpO2 100%   BMI 45.47 kg/m   BP/Weight 08/12/2017 05/06/2017 16/06/958  Systolic BP 454 098 119  Diastolic BP 83 87 83  Wt. (Lbs) 232.8 244.2 241  BMI 45.47 47.69 41.37      Physical Exam  Constitutional: She is oriented to person, place, and time. She appears well-developed and well-nourished.  Cardiovascular: Normal rate, normal heart sounds and intact distal pulses.  No murmur heard. Pulmonary/Chest: Effort normal and breath sounds normal. She has no wheezes. She has no rales. She exhibits no tenderness.  Abdominal: Soft. Bowel sounds are normal. She exhibits no distension and no mass. There is no tenderness.  Musculoskeletal: Normal range of motion. Tenderness: -ve straight leg raise bilaterally.  Neurological: She is alert and oriented to person, place, and time.  Skin: Skin is warm and dry.  Facial hair on the chin  Psychiatric: She has a normal mood and affect.    CMP Latest Ref Rng & Units 08/16/2016 02/27/2016 09/25/2015  Glucose 65 - 99 mg/dL 87 86 89  BUN 7 - 25 mg/dL 8 8 5(L)  Creatinine 0.50 - 1.10 mg/dL 0.62 0.60 0.62  Sodium 135 - 146 mmol/L 138 137 137  Potassium 3.5 - 5.3 mmol/L 3.9 4.4 3.7  Chloride 98 - 110 mmol/L 104 104 105  CO2 20 - 31 mmol/L _0 Calcium 8.6 - 10.2 mg/dL 9.4 9.4 8.8  Total Protein 6.1 - 8.1 g/dL - 7.1 6.4  Total Bilirubin 0.2 - 1.2 mg/dL - 1.0 1.1  Alkaline Phos 33 - 115 U/L - 64 60  AST 10 - 30 U/L - 16 15  ALT 6 - 29 U/L - 21 21    Lab Results  Component Value Date   HGBA1C 4.8 08/16/2016    Assessment & Plan:   1. Essential hypertension Controlled - CMP14+EGFR - spironolactone (ALDACTONE) 50  MG tablet; Take 1 tablet (50 mg total) by mouth daily.  Dispense: 30 tablet; Refill: 3  2. Lumbar radiculopathy Uncontrolled Advised to apply heat Weight loss will help with symptoms - DULoxetine (CYMBALTA) 60 MG capsule; Take 1 capsule (60 mg total) by mouth daily.  Dispense: 30 capsule; Refill: 3 - baclofen (LIORESAL) 10 MG tablet; Take 1 tablet (10 mg total) by mouth 3 (three) times daily.  Dispense: 90 tablet; Refill: 3  3. Paresthesia We will send of labs and if negative and symptoms persist, will consider placing on gabapentin - Hemoglobin A1c - Vitamin B12  4. Pedal edema Low suspicion for CHF-suspect dependent pedal edema Placed on Lasix for short duration Advised on low-sodium diet, elevate feet Use compression stockings   Meds ordered this encounter  Medications  . spironolactone (ALDACTONE) 50 MG tablet    Sig: Take 1 tablet (50 mg total) by mouth daily.    Dispense:  30 tablet    Refill:  3  . DULoxetine (CYMBALTA) 60 MG capsule    Sig: Take 1 capsule (60 mg total) by mouth daily.    Dispense:  30 capsule    Refill:  3  . baclofen (LIORESAL) 10 MG tablet    Sig: Take 1 tablet (10 mg total) by mouth 3 (three) times daily.    Dispense:  90 tablet    Refill:  3  . furosemide (LASIX) 20 MG tablet    Sig: Take 1 tablet (20 mg total) by mouth daily.    Dispense:  7 tablet    Refill:  0    Follow-up: Return in about 3 weeks (around 09/02/2017) for Follow-up on pedal edema.   Charlott Rakes MD

## 2017-08-12 NOTE — Telephone Encounter (Signed)
Pt. Came to the facility to drop off Jury form to be filled out by PCP. Pt. States that she is unable to attend b/c of her medical condition. Form will be placed at PCP box. Please f/u.

## 2017-08-12 NOTE — Patient Instructions (Signed)
Edema Edema is when you have too much fluid in your body or under your skin. Edema may make your legs, feet, and ankles swell up. Swelling is also common in looser tissues, like around your eyes. This is a common condition. It gets more common as you get older. There are many possible causes of edema. Eating too much salt (sodium) and being on your feet or sitting for a long time can cause edema in your legs, feet, and ankles. Hot weather may make edema worse. Edema is usually painless. Your skin may look swollen or shiny. Follow these instructions at home:  Keep the swollen body part raised (elevated) above the level of your heart when you are sitting or lying down.  Do not sit still or stand for a long time.  Do not wear tight clothes. Do not wear garters on your upper legs.  Exercise your legs. This can help the swelling go down.  Wear elastic bandages or support stockings as told by your doctor.  Eat a low-salt (low-sodium) diet to reduce fluid as told by your doctor.  Depending on the cause of your swelling, you may need to limit how much fluid you drink (fluid restriction).  Take over-the-counter and prescription medicines only as told by your doctor. Contact a doctor if:  Treatment is not working.  You have heart, liver, or kidney disease and have symptoms of edema.  You have sudden and unexplained weight gain. Get help right away if:  You have shortness of breath or chest pain.  You cannot breathe when you lie down.  You have pain, redness, or warmth in the swollen areas.  You have heart, liver, or kidney disease and get edema all of a sudden.  You have a fever and your symptoms get worse all of a sudden. Summary  Edema is when you have too much fluid in your body or under your skin.  Edema may make your legs, feet, and ankles swell up. Swelling is also common in looser tissues, like around your eyes.  Raise (elevate) the swollen body part above the level of your  heart when you are sitting or lying down.  Follow your doctor's instructions about diet and how much fluid you can drink (fluid restriction). This information is not intended to replace advice given to you by your health care provider. Make sure you discuss any questions you have with your health care provider. Document Released: 11/24/2007 Document Revised: 06/25/2016 Document Reviewed: 06/25/2016 Elsevier Interactive Patient Education  2017 [employer]  

## 2017-08-13 LAB — CMP14+EGFR
ALT: 18 IU/L (ref 0–32)
AST: 19 IU/L (ref 0–40)
Albumin/Globulin Ratio: 1.7 (ref 1.2–2.2)
Albumin: 4.2 g/dL (ref 3.5–5.5)
Alkaline Phosphatase: 61 IU/L (ref 39–117)
BUN/Creatinine Ratio: 8 — ABNORMAL LOW (ref 9–23)
BUN: 8 mg/dL (ref 6–20)
Bilirubin Total: 1.4 mg/dL — ABNORMAL HIGH (ref 0.0–1.2)
CO2: 23 mmol/L (ref 20–29)
CREATININE: 0.97 mg/dL (ref 0.57–1.00)
Calcium: 9.4 mg/dL (ref 8.7–10.2)
Chloride: 102 mmol/L (ref 96–106)
GFR calc non Af Amer: 74 mL/min/{1.73_m2} (ref >59)
GFR, EST AFRICAN AMERICAN: 85 mL/min/{1.73_m2} (ref >59)
Globulin, Total: 2.5 g/dL (ref 1.5–4.5)
Glucose: 92 mg/dL (ref 65–99)
Potassium: 3.3 mmol/L — ABNORMAL LOW (ref 3.5–5.2)
Sodium: 141 mmol/L (ref 134–144)
TOTAL PROTEIN: 6.7 g/dL (ref 6.0–8.5)

## 2017-08-13 LAB — VITAMIN B12: Vitamin B-12: 306 pg/mL (ref 232–1245)

## 2017-08-13 LAB — HEMOGLOBIN A1C
Est. average glucose Bld gHb Est-mCnc: 91 mg/dL
Hgb A1c MFr Bld: 4.8 % (ref 4.8–5.6)

## 2017-08-15 ENCOUNTER — Other Ambulatory Visit: Payer: Self-pay | Admitting: Family Medicine

## 2017-08-15 MED ORDER — POTASSIUM CHLORIDE ER 10 MEQ PO TBCR
10.0000 meq | EXTENDED_RELEASE_TABLET | Freq: Every day | ORAL | 1 refills | Status: DC
Start: 2017-08-15 — End: 2018-08-29

## 2017-08-15 MED FILL — POTASSIUM CL 10 MEQ TAB SA: 10 | 30 days supply | Qty: 30 | Fill #0

## 2017-08-16 NOTE — Telephone Encounter (Signed)
I have not been abe to get a hold of the patient to ask why she can't go to jury duty.

## 2017-08-18 ENCOUNTER — Telehealth: Payer: Self-pay

## 2017-08-18 NOTE — Telephone Encounter (Signed)
Patient was called and there is no voicemail to leave a message.   If patient returns phone call please inform patient Potassium is low, I have sent a prescription for potassium pills to her pharmacy.

## 2017-08-23 MED FILL — BACLOFEN 10 MG TABLET: 10 | 30 days supply | Qty: 90 | Fill #0

## 2017-09-07 ENCOUNTER — Ambulatory Visit: Payer: Medicaid Other | Admitting: Family Medicine

## 2017-10-20 ENCOUNTER — Ambulatory Visit: Payer: Medicaid Other

## 2017-10-27 ENCOUNTER — Encounter (HOSPITAL_COMMUNITY): Payer: Self-pay | Admitting: Family Medicine

## 2017-10-27 ENCOUNTER — Emergency Department (HOSPITAL_COMMUNITY)
Admission: EM | Admit: 2017-10-27 | Discharge: 2017-10-27 | Disposition: A | Discharge status: Home / Self Care | Payer: Medicaid Other | Attending: Emergency Medicine | Admitting: Emergency Medicine

## 2017-10-27 DIAGNOSIS — M5416 Radiculopathy, lumbar region: Secondary | ICD-10-CM

## 2017-10-27 DIAGNOSIS — G8929 Other chronic pain: Secondary | ICD-10-CM | POA: Diagnosis not present

## 2017-10-27 DIAGNOSIS — I10 Essential (primary) hypertension: Secondary | ICD-10-CM | POA: Diagnosis not present

## 2017-10-27 DIAGNOSIS — Z79899 Other long term (current) drug therapy: Secondary | ICD-10-CM | POA: Diagnosis not present

## 2017-10-27 DIAGNOSIS — M545 Low back pain: Secondary | ICD-10-CM | POA: Insufficient documentation

## 2017-10-27 MED ORDER — METHOCARBAMOL 500 MG PO TABS
500.0000 mg | ORAL_TABLET | Freq: Once | ORAL | Status: AC
  Administered 2017-10-27: 500 mg via ORAL
  Filled 2017-10-27: qty 1

## 2017-10-27 MED ORDER — METHOCARBAMOL 500 MG PO TABS: 1500.0000 mg | ORAL_TABLET | Freq: Once | ORAL | Status: DC

## 2017-10-27 MED ORDER — KETOROLAC TROMETHAMINE 60 MG/2ML IM SOLN
60.0000 mg | Freq: Once | INTRAMUSCULAR | Status: AC
  Administered 2017-10-27: 60 mg via INTRAMUSCULAR
  Filled 2017-10-27: qty 2

## 2017-10-27 MED ORDER — BACLOFEN 10 MG PO TABS: 10.0000 mg | ORAL_TABLET | Freq: Three times a day (TID) | ORAL | 0 refills | Status: DC

## 2017-10-27 MED ORDER — KETOROLAC TROMETHAMINE 30 MG/ML IJ SOLN: 30.0000 mg | Freq: Once | INTRAMUSCULAR | Status: DC

## 2017-10-27 MED ORDER — METHOCARBAMOL 500 MG PO TABS: 500.0000 mg | ORAL_TABLET | Freq: Two times a day (BID) | ORAL | 0 refills | Status: DC

## 2017-10-27 MED ORDER — LIDOCAINE 5 % EX PTCH: 1.0000 | MEDICATED_PATCH | CUTANEOUS | 0 refills | Status: DC

## 2017-10-27 NOTE — Discharge Instructions (Addendum)
Thank you for allowing me to provide your care today in the emergency department.  Please call and schedule a follow-up appointment with Dr. Tacey Heap with [facility].  I have refilled your prescription of baclofen.  Take 1 tablet by mouth every 8 hours.  You can also take 1 tablet of Robaxin by mouth every 12 hours.  You have been given your first dose today in the emergency department.  You can apply 1 lidocaine patch to your low back to any areas that are sore.  Do not apply multiple patches at the same time.   Please keep your appointment next week with your primary care provider.  If you develop new or worsening symptoms including if you develop a high fever, weakness in your legs, or if pee or poop on yourself, please return to the emergency department for re-evaluation.

## 2017-10-27 NOTE — ED Triage Notes (Signed)
Patient is from home and transported via [facility]. Patient is complaining of chronic back pain. EMS denies any recent injury or event for the cause of the pain. Patient has had chronic pain for the last 5 years from an event that caused bulging disc. Patient took a (unknown) medication for pain with no relief. Patient is ambulatory with a steady gait.

## 2017-10-27 NOTE — ED Triage Notes (Signed)
Patient denies any urinary symptoms or vaginal pain.

## 2017-10-27 NOTE — ED Provider Notes (Signed)
[facility]-EMERGENCY DEPT Provider Note   CSN: [id number] Arrival date & time: 10/27/17  1130     History   Chief Complaint Chief Complaint  Patient presents with  . Back Pain    HPI Jacqueline Juarez is a 37 y.o. female with a history of chronic L5-S1 disc bulging and facet hypertrophy and by foraminal stenosis and peripheral neuropathy who presents to the emergency department with a chief complaint of low back pain.  The patient endorses constant mid and left-sided low back pain, characterized as sharp, that is been present for the last few months.  Pain has been constant, but gradually worsening since onset.  She denies any urinary or fecal incontinence, fever, chills, pain to the bilateral lower extremities, dysuria, hematuria, vaginal pain or discharge, weakness, or worse than baseline numbness.  No new trauma, falls, or injuries.  She is a followed by Dr. Tacey Heap with neurology.  She was last seen in June 2018 and has not followed up since that time.  She reports that she has been taking gabapentin and Cymbalta daily with no relief.  She was previously taking naproxen, but is stopped taking that medication over the last few months.  She was taking baclofen, but is currently out of the medication.  She states " I tried to follow-up with primary care, but they cannot see me until next week.  I thought about coming over here to have this checked out last week, but I decided to come today."  She denies any significant change to her symptoms over the last week.  She ambulates independently without assistive devices.  She is not currently employed.  Poor historian.  The history is provided by the patient. No language interpreter was used.    Past Medical History:  Diagnosis Date  . Anxiety    At age 68  . Arthritis Dx 2014  . Chronic back pain   . High cholesterol Dx 2014  . Hypertension DX 2014  . Lumbar radiculopathy Dx 2015  . Panic    At age 58  .  Paresthesias    left hand, bilat feet  . Peripheral neuropathy    left hand    Patient Active Problem List   Diagnosis Date Noted  . Lumbar radiculopathy 03/23/2017  . Restless leg 11/24/2016  . Right calf pain 09/25/2015  . Constipation 07/10/2015  . HTN (hypertension) 09/30/2014  . Tinea pedis 09/30/2014  . Onychomycosis of toenail 09/30/2014  . Intertrigo 09/30/2014  . Abnormality of gait 07/02/2014  . Low back pain 06/06/2014  . Paresthesia 06/06/2014    Past Surgical History:  Procedure Laterality Date  . NO PAST SURGERIES       OB History   None      Home Medications    Prior to Admission medications   Medication Sig Start Date End Date Taking? Authorizing Provider  gabapentin (NEURONTIN) 300 MG capsule Take 300-600 mg by mouth at bedtime.   Yes [provider]  potassium chloride (KLOR-CON 10) 10 MEQ tablet Take 1 tablet (10 mEq total) by mouth daily. 08/15/17  Yes Hoy Register, MD  spironolactone (ALDACTONE) 50 MG tablet Take 1 tablet (50 mg total) by mouth daily. 08/12/17  Yes Hoy Register, MD  baclofen (LIORESAL) 10 MG tablet Take 1 tablet (10 mg total) by mouth 3 (three) times daily. 10/27/17   Garek Schuneman A, PA-C  lidocaine (LIDODERM) 5 % Place 1 patch onto the skin daily. Remove & Discard patch within 12  hours or as directed by MD 10/27/17   Donni Oglesby A, PA-C  methocarbamol (ROBAXIN) 500 MG tablet Take 1 tablet (500 mg total) by mouth 2 (two) times daily. 10/27/17   Suzan Manon A, PA-C    Family History Family History  Problem Relation Age of Onset  . Arthritis Mother   . Arthritis Father     Social History Social History   Tobacco Use  . Smoking status: Never Smoker  . Smokeless tobacco: Never Used  Substance Use Topics  . Alcohol use: Not Currently  . Drug use: No     Allergies   Penicillins   Review of Systems Review of Systems  Constitutional: Negative for chills and fever.  HENT: Negative for ear pain and sore  throat.   Eyes: Negative for pain and visual disturbance.  Respiratory: Negative for cough and shortness of breath.   Cardiovascular: Negative for chest pain and palpitations.  Gastrointestinal: Negative for abdominal pain and vomiting.  Genitourinary: Negative for dysuria, frequency, hematuria and vaginal discharge.  Musculoskeletal: Positive for back pain and myalgias. Negative for arthralgias, gait problem, neck pain and neck stiffness.  Skin: Negative for color change and rash.  Neurological: Positive for numbness (Peripheral neuropathy at baseline). Negative for seizures, syncope and weakness.  All other systems reviewed and are negative.    Physical Exam Updated Vital Signs BP (!) 147/106   Pulse 76   Temp 99.3 F (37.4 C) (Oral)   Resp 20   Ht  (1.626 m)   Wt 100.7 kg (222 lb)   SpO2 100%   BMI 38.11 kg/m   Physical Exam  Constitutional: She appears well-developed and well-nourished. No distress.  HENT:  Head: Normocephalic and atraumatic.  Mouth/Throat: Oropharynx is clear and moist. No oropharyngeal exudate.  Eyes: Conjunctivae are normal.  Neck: Normal range of motion. Neck supple.  Full ROM without pain  Cardiovascular: Normal rate, regular rhythm and intact distal pulses. Exam reveals no gallop and no friction rub.  No murmur heard. Pulmonary/Chest: Effort normal and breath sounds normal. No respiratory distress. She has no wheezes.  Abdominal: Soft. She exhibits no distension and no mass. There is no tenderness. There is no rebound and no guarding. No hernia.  Musculoskeletal:  Full range of motion of the T-spine and L-spine No midline tenderness to the  T-spine.  Mild tenderness to the midline spinous processes of the L-spine.  And bilateral tenderness to palpation of the paraspinous muscles of the L-spine.  No overlying erythema, edema, or warmth.  Lymphadenopathy:    She has no cervical adenopathy.  Neurological: She is alert. She has normal reflexes.    Speech is clear and goal oriented, follows commands Normal 5/5 strength in upper and lower extremities bilaterally including dorsiflexion and plantar flexion, strong and equal grip strength Sensation normal to light and sharp touch Moves extremities without ataxia, coordination intact Normal gait Normal balance   Skin: Skin is warm and dry. Capillary refill takes less than 2 seconds. No rash noted. She is not diaphoretic. No erythema.  Psychiatric: She has a normal mood and affect. Her behavior is normal.  Nursing note and vitals reviewed.    ED Treatments / Results  Labs (all labs ordered are listed, but only abnormal results are displayed) Labs Reviewed - No data to display  EKG None  Radiology No results found.  Procedures Procedures (including critical care time)  Medications Ordered in ED Medications  methocarbamol (ROBAXIN) tablet 500 mg (500 mg Oral Given  10/27/17 1640)  ketorolac (TORADOL) injection 60 mg (60 mg Intramuscular Given 10/27/17 1641)     Initial Impression / Assessment and Plan / ED Course  I have reviewed the triage vital signs and the nursing notes.  Pertinent labs & imaging results that were available during my care of the patient were reviewed by me and considered in my medical decision making (see chart for details).     37 year old female with a history of chronic L5-S1 disc bulging and facet hypertrophy and by foraminal stenosis and peripheral neuropathy sitting with acute exacerbation of chronic back pain. No neurological deficits and normal neuro exam.  Symmetric tandem gait in the ED.  No urinary or bowel incontinence.  No concern for cauda equina, infection, AAA, infection, or fracture.  No constitutional symptoms including fever, chills, or weight loss,  No h/o cancer, IVDU.  Will refill the patient's home baclofen and prescribe her a short course of Robaxin and lidocaine patches.  Discussed follow-up with Dr. Tacey Heap. Discussed ED return precaution  and indications for PCP follow up. The patient acknowledges the plan and is agreeable at this time.    Final Clinical Impressions(s) / ED Diagnoses   Final diagnoses:  Acute exacerbation of chronic low back pain    ED Discharge Orders        Ordered    baclofen (LIORESAL) 10 MG tablet  3 times daily     10/27/17 1558    methocarbamol (ROBAXIN) 500 MG tablet  2 times daily     10/27/17 1558    lidocaine (LIDODERM) 5 %  Every 24 hours     10/27/17 1558       Zlata Alcaide A, PA-C 10/27/17 1644    Terrilee Files, MD 10/27/17 1827

## 2017-11-01 MED FILL — SPIRONOLACTONE 50 MG TABLET: 50 | 30 days supply | Qty: 30 | Fill #1

## 2017-11-02 ENCOUNTER — Ambulatory Visit: Payer: Medicaid Other | Admitting: Neurology

## 2017-11-02 ENCOUNTER — Encounter: Payer: Self-pay | Admitting: Neurology

## 2017-11-02 VITALS — BP 138/92 | Ht 64.0 in | Wt 227.5 lb

## 2017-11-02 DIAGNOSIS — M545 Low back pain, unspecified: Secondary | ICD-10-CM

## 2017-11-02 MED FILL — BACLOFEN 10 MG TABLET: 10 | 30 days supply | Qty: 90 | Fill #1

## 2017-11-02 NOTE — Progress Notes (Signed)
PATIENT: Jacqueline Juarez DOB: 1980-11-03  REASON FOR VISIT: follow up- numbness in extremities, low back pain Primary care physician:Funches, Josalyn, MD  HISTORY OF PRESENT ILLNESS: I saw Jacqueline Juarez in 2016 for evaluation of numbness in left foot, left hand, low back pain, presented to the emergency room,  She had a past medical history of depression anxiety, is on disability due to her mood disorder, lives with her teenager children, she does not drive.  She has a history of chronic low back pain, she presented to emergency room in November 20 first 2015, with constellation of complains, numbness in her left hand, left foot, intermittent, worsening low back pain, was evaluated by neuro hospitalist Dr. Hosie Poisson,  MRI of the brain, and cervical spine with without contrast showed no significant abnormality Lab, normal CBC, CMP  We have initiated more evaluation, MRI of the lumbar and thoracic spine in 2016. MRI of the lumbar spine showed a mild herniated disc at L5-S1 level with no significant stenosis .  She was given treatment of gabapentin, NSAIDs, reported gabapentin has somewhat helpful, last clinical visit was in August 2016.  UPDATE November 24 2016: Today, she complains of bilateral leg numbness, tingling, tightness, jumps, she has difficulty sleeping, difficulty holding still. 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  She continue complains of low back pain, has tried physical therapy, over-the-counter NSAIDs without helping her,  REVIEW OF SYSTEMS: Out of a complete 14 system review of symptoms, the patient complains only of the following symptoms, and all other reviewed systems are negative.    ALLERGIES: Allergies  Allergen Reactions  . Penicillins Hives    Has patient had a PCN reaction causing immediate rash, facial/tongue/throat swelling, SOB or lightheadedness with hypotension: No Has patient had a PCN reaction causing severe rash involving mucus membranes or skin necrosis: No Has patient had a PCN reaction that required hospitalization No Has patient had a PCN reaction occurring within the last 10 years: No If all of the above answers are "NO", then may proceed with Cephalosporin use.    HOME MEDICATIONS: Outpatient Medications Prior to Visit  Medication Sig Dispense Refill  . baclofen (LIORESAL) 10 MG tablet Take 1 tablet (10 mg total) by mouth 3 (three) times daily. 90 tablet 0  . gabapentin (NEURONTIN) 300 MG capsule Take 300-600 mg by mouth at bedtime.    . lidocaine (LIDODERM) 5 % Place 1 patch onto the skin daily. Remove & Discard patch within 12 hours or as directed by MD 30 patch 0  .  methocarbamol (ROBAXIN) 500 MG tablet Take 1 tablet (500 mg total) by mouth 2 (two) times daily. 20 tablet 0  . potassium chloride (KLOR-CON 10) 10 MEQ tablet Take 1 tablet (10 mEq total) by mouth daily. 30 tablet 1  . spironolactone (ALDACTONE) 50 MG tablet Take 1 tablet (50 mg total) by mouth daily. 30 tablet 3   No  facility-administered medications prior to visit.     PAST MEDICAL HISTORY: Past Medical History:  Diagnosis Date  . Anxiety    At age 24  . Arthritis Dx 2014  . Chronic back pain   . High cholesterol Dx 2014  . Hypertension DX 2014  . Lumbar radiculopathy Dx 2015  . Panic    At age 38  . Paresthesias    left hand, bilat feet  . Peripheral neuropathy    left hand    PAST SURGICAL HISTORY: Past Surgical History:  Procedure Laterality Date  . NO PAST SURGERIES      FAMILY HISTORY: Family History  Problem Relation Age of Onset  . Arthritis Mother   . Arthritis Father     SOCIAL HISTORY: Social History   Socioeconomic History  . Marital status: Single    Spouse name: Not on file  . Number of children: 2  . Years of education: 9th  . Highest education level: Not on file  Occupational History    Comment: disabled  Social Needs  . Financial resource strain: Not on file  . Food insecurity:    Worry: Not on file    Inability: Not on file  . Transportation needs:    Medical: Not on file    Non-medical: Not on file  Tobacco Use  . Smoking status: Never Smoker  . Smokeless tobacco: Never Used  Substance and Sexual Activity  . Alcohol use: Not Currently  . Drug use: No  . Sexual activity: Not on file  Lifestyle  . Physical activity:    Days per week: Not on file    Minutes per session: Not on file  . Stress: Not on file  Relationships  . Social connections:    Talks on phone: Not on file    Gets together: Not on file    Attends religious service: Not on file    Active member of club or organization: Not on file    Attends meetings of clubs or organizations: Not on file    Relationship status: Not on file  . Intimate partner violence:    Fear of current or ex partner: Not on file    Emotionally abused: Not on file    Physically abused: Not on file    Forced sexual activity: Not on file  Other Topics Concern  . Not on file  Social History Narrative    Patient is single and lives at home with her children.    Patient is disabled.   Education 9 th grade.   Right handed.   Caffeine sweet tea two bottles daily.      PHYSICAL EXAM  Vitals:   11/02/17 0917  BP: (!) 138/92  Weight: 227 lb 8 oz (103.2 kg)  Height:  (1.626 m)   Body mass index is 39.05 kg/m.  PHYSICAL EXAMNIATION:  Gen: NAD, conversant, well nourised, obese, well groomed                     Cardiovascular: Regular rate rhythm, no peripheral edema, warm, nontender. Eyes: Conjunctivae clear without exudates or hemorrhage  Neck: Supple, no carotid bruits. Pulmonary: Clear to auscultation bilaterally   NEUROLOGICAL EXAM:  MENTAL STATUS: Speech:    Speech is normal; fluent and spontaneous with normal comprehension.  Cognition:     Orientation to time, place and person     Normal recent and remote memory     Normal Attention span and concentration     Normal Language, naming, repeating,spontaneous speech     Fund of knowledge   CRANIAL NERVES: CN II: Visual fields are full to confrontation. Pupils are round equal and briskly reactive to light. CN III, IV, VI: extraocular movement are normal. No ptosis. CN V: Facial sensation is intact to pinprick in all 3 divisions bilaterally. Corneal responses are intact.  CN VII: Face is symmetric with normal eye closure and smile. CN VIII: Hearing is normal to rubbing fingers CN IX, X: Palate elevates symmetrically. Phonation is normal. CN XI: Head turning and shoulder shrug are intact CN XII: Tongue is midline with normal movements and no atrophy.  MOTOR: There is no pronator drift of out-stretched arms. Muscle bulk and tone are normal. Muscle strength is normal.  REFLEXES: Reflexes are 2+ and symmetric at the biceps, triceps, knees, and ankles. Plantar responses are flexor.  SENSORY: Intact to light touch, pinprick, positional and vibratory sensation are intact in fingers and toes.  COORDINATION: Rapid  alternating movements and fine finger movements are intact. There is no dysmetria on finger-to-nose and heel-knee-shin.    GAIT/STANCE:  Cautious, steady,     DIAGNOSTIC DATA (LABS, IMAGING, TESTING) - I reviewed patient records, labs, notes, testing and imaging myself where available.      Component Value Date/Time   NA 141 08/12/2017 1546   K 3.3 (L) 08/12/2017 1546   CL 102 08/12/2017 1546   CO2 23 08/12/2017 1546   GLUCOSE 92 08/12/2017 1546   GLUCOSE 87 08/16/2016 1054   BUN 8 08/12/2017 1546   CREATININE 0.97 08/12/2017 1546   CREATININE 0.62 08/16/2016 1054   CALCIUM 9.4 08/12/2017 1546   PROT 6.7 08/12/2017 1546   ALBUMIN 4.2 08/12/2017 1546   AST 19 08/12/2017 1546   ALT 18 08/12/2017 1546   ALKPHOS 61 08/12/2017 1546   BILITOT 1.4 (H) 08/12/2017 1546   GFRNONAA 74 08/12/2017 1546   GFRNONAA >89 08/16/2016 1054   GFRAA 85 08/12/2017 1546   GFRAA >89 08/16/2016 1054      ASSESSMENT AND PLAN 37 y.o. year old   Chronic low back pain   Most consistent with musculoskeletal etiology  Extensive imaging study and electrodiagnostic study showed no evidence of radiculopathy  Continue back stretching exercise,   As needed NSAIDs   no further neurological evaluation work-up as needed   Levert Feinstein, M.D. Ph.D.  [facility] [address] [place], [place] [address] Phone: [phone] Fax:      [phone]

## 2017-11-18 ENCOUNTER — Ambulatory Visit: Payer: Medicaid Other | Admitting: Family Medicine

## 2018-01-11 ENCOUNTER — Telehealth: Payer: Self-pay | Admitting: Family Medicine

## 2018-01-11 ENCOUNTER — Ambulatory Visit: Payer: Medicaid Other

## 2018-01-11 MED FILL — SPIRONOLACTONE 50 MG TABLET: 50 | 30 days supply | Qty: 30 | Fill #2

## 2018-01-11 NOTE — Telephone Encounter (Signed)
Patient called stating that she was not able to come to her appt. Today due to transportation. Patient was told that she has has more than three no shows within this year and she would be on standby. Patient was told to come in once she had transportation and she would have to wait until a provider had a no show for her to be seen. Patient understood and had no questions.

## 2018-05-11 ENCOUNTER — Emergency Department (HOSPITAL_COMMUNITY)
Admission: EM | Admit: 2018-05-11 | Discharge: 2018-05-11 | Disposition: A | Discharge status: Home / Self Care | Payer: Medicaid Other | Attending: Emergency Medicine | Admitting: Emergency Medicine

## 2018-05-11 ENCOUNTER — Encounter (HOSPITAL_COMMUNITY): Payer: Self-pay

## 2018-05-11 ENCOUNTER — Other Ambulatory Visit: Payer: Self-pay

## 2018-05-11 DIAGNOSIS — G8929 Other chronic pain: Secondary | ICD-10-CM | POA: Insufficient documentation

## 2018-05-11 DIAGNOSIS — I10 Essential (primary) hypertension: Secondary | ICD-10-CM | POA: Diagnosis not present

## 2018-05-11 DIAGNOSIS — M545 Low back pain: Secondary | ICD-10-CM | POA: Diagnosis not present

## 2018-05-11 DIAGNOSIS — M7662 Achilles tendinitis, left leg: Secondary | ICD-10-CM | POA: Diagnosis not present

## 2018-05-11 DIAGNOSIS — Z79899 Other long term (current) drug therapy: Secondary | ICD-10-CM | POA: Diagnosis not present

## 2018-05-11 MED ORDER — DEXAMETHASONE SODIUM PHOSPHATE 10 MG/ML IJ SOLN
10.0000 mg | Freq: Once | INTRAMUSCULAR | Status: AC
  Administered 2018-05-11: 10 mg via INTRAMUSCULAR
  Filled 2018-05-11: qty 1

## 2018-05-11 MED ORDER — PREDNISONE 10 MG (21) PO TBPK: ORAL_TABLET | ORAL | 0 refills | Status: DC

## 2018-05-11 MED ORDER — KETOROLAC TROMETHAMINE 30 MG/ML IJ SOLN
30.0000 mg | Freq: Once | INTRAMUSCULAR | Status: AC
  Administered 2018-05-11: 30 mg via INTRAMUSCULAR
  Filled 2018-05-11: qty 1

## 2018-05-11 NOTE — ED Provider Notes (Signed)
[facility]-EMERGENCY DEPT Provider Note   CSN: [id number] Arrival date & time: 05/11/18  1324     History   Chief Complaint Chief Complaint  Patient presents with  . Back Pain  . Foot Pain    HPI Jacqueline Juarez is a 37 y.o. female.  Pt presents to the ED today with back pain and left foot pain.  She has had the back pain for years.  She has had MRIs and she has seen neurology.  The pt has been taking neurontin and naprosyn without help of sx.  She is having trouble sleeping.  The pt's foot hurts at the top of her heel at achilles tendon insertion.     Past Medical History:  Diagnosis Date  . Anxiety    At age 35  . Arthritis Dx 2014  . Chronic back pain   . High cholesterol Dx 2014  . Hypertension DX 2014  . Lumbar radiculopathy Dx 2015  . Panic    At age 9  . Paresthesias    left hand, bilat feet  . Peripheral neuropathy    left hand    Patient Active Problem List   Diagnosis Date Noted  . Bilateral low back pain without sciatica 11/02/2017  . Lumbar radiculopathy 03/23/2017  . Restless leg 11/24/2016  . Right calf pain 09/25/2015  . Constipation 07/10/2015  . HTN (hypertension) 09/30/2014  . Tinea pedis 09/30/2014  . Onychomycosis of toenail 09/30/2014  . Intertrigo 09/30/2014  . Abnormality of gait 07/02/2014  . Low back pain 06/06/2014  . Paresthesia 06/06/2014    Past Surgical History:  Procedure Laterality Date  . NO PAST SURGERIES       OB History   None      Home Medications    Prior to Admission medications   Medication Sig Start Date End Date Taking? Authorizing Provider  baclofen (LIORESAL) 10 MG tablet Take 1 tablet (10 mg total) by mouth 3 (three) times daily. 10/27/17   McDonald, Mia A, PA-C  gabapentin (NEURONTIN) 300 MG capsule Take 300-600 mg by mouth at bedtime.    [provider]  lidocaine (LIDODERM) 5 % Place 1 patch onto the skin daily. Remove & Discard patch within 12 hours or as  directed by MD 10/27/17   McDonald, Mia A, PA-C  methocarbamol (ROBAXIN) 500 MG tablet Take 1 tablet (500 mg total) by mouth 2 (two) times daily. 10/27/17   McDonald, Mia A, PA-C  potassium chloride (KLOR-CON 10) 10 MEQ tablet Take 1 tablet (10 mEq total) by mouth daily. 08/15/17   Hoy Register, MD  predniSONE (STERAPRED UNI-PAK 21 TAB) 10 MG (21) TBPK tablet Take 6 tabs for 2 days, then 5 for 2 days, then 4 for 2 days, then 3 for 2 days, 2 for 2 days, then 1 for 2 days 05/11/18   Jacalyn Lefevre, MD  spironolactone (ALDACTONE) 50 MG tablet Take 1 tablet (50 mg total) by mouth daily. 08/12/17   Hoy Register, MD    Family History Family History  Problem Relation Age of Onset  . Arthritis Mother   . Arthritis Father     Social History Social History   Tobacco Use  . Smoking status: Never Smoker  . Smokeless tobacco: Never Used  Substance Use Topics  . Alcohol use: Not Currently  . Drug use: No     Allergies   Penicillins   Review of Systems Review of Systems  Musculoskeletal: Positive for back pain.  Left foot pain  All other systems reviewed and are negative.    Physical Exam Updated Vital Signs BP (!) 143/99 (BP Location: Left Arm)   Pulse 88   Temp 98.3 F (36.8 C) (Oral)   Resp 16   Ht 5\' 4"  (1.626 m)   Wt 98.9 kg   LMP 04/27/2018   SpO2 97%   BMI 37.42 kg/m   Physical Exam  Constitutional: She is oriented to person, place, and time. She appears well-developed and well-nourished.  HENT:  Head: Normocephalic and atraumatic.  Right Ear: External ear normal.  Left Ear: External ear normal.  Nose: Nose normal.  Mouth/Throat: Oropharynx is clear and moist.  Eyes: Pupils are equal, round, and reactive to light. Conjunctivae and EOM are normal.  Neck: Normal range of motion. Neck supple.  Cardiovascular: Normal rate, regular rhythm, normal heart sounds and intact distal pulses.  Pulmonary/Chest: Effort normal and breath sounds normal.  Abdominal: Soft.  Bowel sounds are normal.  Musculoskeletal: Normal range of motion.       Back:       Feet:  Neurological: She is alert and oriented to person, place, and time.  Skin: Skin is warm and dry. Capillary refill takes less than 2 seconds.  Psychiatric: She has a normal mood and affect.  Nursing note and vitals reviewed.    ED Treatments / Results  Labs (all labs ordered are listed, but only abnormal results are displayed) Labs Reviewed - No data to display  EKG None  Radiology No results found.  Procedures Procedures (including critical care time)  Medications Ordered in ED Medications  ketorolac (TORADOL) 30 MG/ML injection 30 mg (has no administration in time range)  dexamethasone (DECADRON) injection 10 mg (has no administration in time range)     Initial Impression / Assessment and Plan / ED Course  I have reviewed the triage vital signs and the nursing notes.  Pertinent labs & imaging results that were available during my care of the patient were reviewed by me and considered in my medical decision making (see chart for details).     Pt may have achilles tendonitis.  Back pain has been chronic.  No red flags on neuro exam.  She is able to ambulate.  She will be given a dose of decadron and toradol prior to d/c.  She has an appt with her doctor next week.  She will be started on prednisone.  Return if worse.  F/u with pcp/podiatry.  Final Clinical Impressions(s) / ED Diagnoses   Final diagnoses:  Chronic midline low back pain without sciatica  Tendonitis, Achilles, left    ED Discharge Orders         Ordered    predniSONE (STERAPRED UNI-PAK 21 TAB) 10 MG (21) TBPK tablet     05/11/18 0945           Jacalyn LefevreHaviland, Armanii Pressnell, MD 05/11/18 [phone]

## 2018-05-11 NOTE — ED Triage Notes (Signed)
Patient c/o upper, mid, and lower back pain x 2 days. Patient c/o left achilles area pain and swelling X 2 days.

## 2018-08-01 ENCOUNTER — Encounter: Payer: Self-pay | Admitting: Family Medicine

## 2018-08-03 ENCOUNTER — Ambulatory Visit: Payer: Medicaid Other | Admitting: Family Medicine

## 2018-08-07 ENCOUNTER — Other Ambulatory Visit: Payer: Self-pay

## 2018-08-07 DIAGNOSIS — Z1231 Encounter for screening mammogram for malignant neoplasm of breast: Secondary | ICD-10-CM

## 2018-08-29 ENCOUNTER — Ambulatory Visit (INDEPENDENT_AMBULATORY_CARE_PROVIDER_SITE_OTHER): Payer: Medicaid Other | Admitting: Family Medicine

## 2018-08-29 ENCOUNTER — Encounter: Payer: Self-pay | Admitting: Family Medicine

## 2018-08-29 VITALS — BP 133/87 | HR 91 | Temp 98.1°F | Resp 17 | Ht 64.5 in | Wt 211.2 lb

## 2018-08-29 DIAGNOSIS — Z7689 Persons encountering health services in other specified circumstances: Secondary | ICD-10-CM

## 2018-08-29 DIAGNOSIS — N3001 Acute cystitis with hematuria: Secondary | ICD-10-CM | POA: Diagnosis not present

## 2018-08-29 DIAGNOSIS — D649 Anemia, unspecified: Secondary | ICD-10-CM | POA: Diagnosis not present

## 2018-08-29 DIAGNOSIS — I10 Essential (primary) hypertension: Secondary | ICD-10-CM

## 2018-08-29 DIAGNOSIS — Z3202 Encounter for pregnancy test, result negative: Secondary | ICD-10-CM | POA: Diagnosis not present

## 2018-08-29 DIAGNOSIS — R5383 Other fatigue: Secondary | ICD-10-CM

## 2018-08-29 DIAGNOSIS — R81 Glycosuria: Secondary | ICD-10-CM | POA: Diagnosis not present

## 2018-08-29 DIAGNOSIS — Z1389 Encounter for screening for other disorder: Secondary | ICD-10-CM

## 2018-08-29 DIAGNOSIS — E876 Hypokalemia: Secondary | ICD-10-CM

## 2018-08-29 LAB — POCT URINALYSIS DIP (CLINITEK)
BILIRUBIN UA: NEGATIVE
BILIRUBIN UA: NEGATIVE mg/dL
NITRITE UA: POSITIVE — AB
POC PROTEIN,UA: NEGATIVE
Spec Grav, UA: >=1.03 — AB (ref 1.010–1.025)
Urobilinogen, UA: 0.2 E.U./dL
pH, UA: 6 (ref 5.0–8.0)

## 2018-08-29 LAB — POCT URINE PREGNANCY: Preg Test, Ur: NEGATIVE

## 2018-08-29 MED ORDER — NITROFURANTOIN MONOHYD MACRO 100 MG PO CAPS: 100.0000 mg | ORAL_CAPSULE | Freq: Two times a day (BID) | ORAL | 0 refills | Status: DC

## 2018-08-29 NOTE — Progress Notes (Signed)
Jacqueline Juarez, is a 38 y.o. female  MVH:[id number]  WUX:[id number]  DOB - Mar 16, 1981  CC:  Chief Complaint  Patient presents with  . Establish Care       HPI: Jacqueline Juarez is a 38 y.o. female is here today to establish care and evaluation of fatigue and potassium.  Jacqueline Juarez has Low back pain; Paresthesia; Abnormality of gait; HTN (hypertension); Tinea pedis; Onychomycosis of toenail; Intertrigo; Constipation; Right calf pain; Restless leg; Lumbar radiculopathy; and Bilateral low back pain without sciatica on their problem list.    Patient was previously followed by Jovita Kussmaul and [facility] in the past. Concerns today include worsening fatigue. She reports a history of anemia and would like her iron level checked. She has history of chronic hypokalemia and was previously prescribed low dose potassium daily-out of medication now. Concern for low potassium. Denies also aches or palpitations. She has hypertension. Reports no routine home monitoring of blood pressure. No routine physical activity.Current Body mass index is 35.69 kg/m. She is a non-smoker. She is prescried chronic diuretic therapy for blood pressure control and chronic leg edema. She takes Gabapentin for chronic back pain and restless leg syndrome. Denies new headaches, chest pain, abdominal pain, nausea, new weakness , numbness or tingling, SOB, edema, or worrisome cough.   Current medications: Current Outpatient Medications:  .  chlorthalidone (HYGROTON) 25 MG tablet, Take 1 tablet by mouth daily., Disp: , Rfl:  .  gabapentin (NEURONTIN) 300 MG capsule, Take 300-600 mg by mouth at bedtime. Take 1 capsule every morning., Disp: , Rfl:  .  KLOR-CON M20 20 MEQ tablet, Take 20 mEq by mouth 3 (three) times daily., Disp: , Rfl:  .  Magnesium Gluconate 500 (27 Mg) MG TABS, Take 1 tablet by mouth daily., Disp: , Rfl:    Pertinent family medical history: family history includes Arthritis in her father and mother; Hypertension in her  mother.   Allergies  Allergen Reactions  . Penicillins Hives    Has patient had a PCN reaction causing immediate rash, facial/tongue/throat swelling, SOB or lightheadedness with hypotension: No Has patient had a PCN reaction causing severe rash involving mucus membranes or skin necrosis: No Has patient had a PCN reaction that required hospitalization No Has patient had a PCN reaction occurring within the last 10 years: No If all of the above answers are "NO", then may proceed with Cephalosporin use.    Social History   Socioeconomic History  . Marital status: Single    Spouse name: Not on file  . Number of children: 2  . Years of education: 9th  . Highest education level: Not on file  Occupational History    Comment: disabled  Social Needs  . Financial resource strain: Not on file  . Food insecurity:    Worry: Not on file    Inability: Not on file  . Transportation needs:    Medical: Not on file    Non-medical: Not on file  Tobacco Use  . Smoking status: Never Smoker  . Smokeless tobacco: Never Used  Substance and Sexual Activity  . Alcohol use: Not Currently  . Drug use: No  . Sexual activity: Not on file  Lifestyle  . Physical activity:    Days per week: Not on file    Minutes per session: Not on file  . Stress: Not on file  Relationships  . Social connections:    Talks on phone: Not on file    Gets together: Not on file  Attends religious service: Not on file    Active member of club or organization: Not on file    Attends meetings of clubs or organizations: Not on file    Relationship status: Not on file  . Intimate partner violence:    Fear of current or ex partner: Not on file    Emotionally abused: Not on file    Physically abused: Not on file    Forced sexual activity: Not on file  Other Topics Concern  . Not on file  Social History Narrative   Patient is single and lives at home with her children.    Patient is disabled.   Education 9 th grade.    Right handed.   Caffeine sweet tea two bottles daily.    Review of Systems: Pertinent negatives listed in HPI Objective:   Vitals:   08/29/18 1012  BP: 133/87  Pulse: 91  Resp: 17  Temp: 98.1 F (36.7 C)  SpO2: 96%    BP Readings from Last 3 Encounters:  08/29/18 133/87  05/11/18 129/81  11/02/17 (!) 138/92    Filed Weights   08/29/18 1012  Weight: 211 lb 3.2 oz (95.8 kg)      Physical Exam: Constitutional: Patient appears well-developed and well-nourished. No distress. HENT: Normocephalic, atraumatic, External right and left ear normal. Oropharynx is clear and moist.  Eyes: Conjunctivae and EOM are normal. PERRLA, no scleral icterus. Neck: Normal ROM. Neck supple. No JVD. No tracheal deviation. No thyromegaly. CVS: RRR, S1/S2 +, no murmurs, no gallops, no carotid bruit.  Pulmonary: Effort and breath sounds normal, no stridor, rhonchi, wheezes, rales.  Abdominal: Soft. BS +, no distension, tenderness, rebound or guarding.  Musculoskeletal: Normal range of motion. Trace edema  Neuro: Alert. Normal muscle tone coordination. Normal gait.  Skin: Skin is warm and dry. No rash noted. Not diaphoretic. No erythema. No pallor. Psychiatric: Normal mood and affect. Behavior, judgment, thought content normal.  Lab Results (prior encounters)  Lab Results  Component Value Date   WBC 6.5 02/27/2016   HGB 13.0 02/27/2016   HCT 38.0 02/27/2016   MCV 88.6 02/27/2016   PLT 354 02/27/2016   Lab Results  Component Value Date   CREATININE 0.97 08/12/2017   BUN 8 08/12/2017   NA 141 08/12/2017   K 3.3 (L) 08/12/2017   CL 102 08/12/2017   CO2 23 08/12/2017    Lab Results  Component Value Date   HGBA1C 4.8 08/12/2017       Assessment and plan:  1. Encounter to establish care 2. Screening for blood or protein in urine - POCT URINALYSIS DIP (CLINITEK), abnormal-findings consistent with UTI - POCT urine pregnancy, negative   3. Glucose found in urine on examination -  Comprehensive metabolic panel - Hemoglobin A1c  4. Other fatigue - CBC with Differential and  Iron panel   5. Essential hypertension, well controlled. -Continue chlorthalidone -Reduce sodium and recommend routine physical activity as tolerated.  6. Acute cystitis with hematuria -Macrobid 100 mg twice daily x10 days.  Urine culture pending   Return for Fasting labs with PAP -patient's convenience .   The patient was given clear instructions to go to ER or return to medical center if symptoms don't improve, worsen or new problems develop. The patient verbalized understanding. The patient was advised  to call and obtain lab results if they haven't heard anything from out office within 7-10 business days.  Joaquin Courts, FNP Primary Care at [facility] [address], [place]  [place] [address] [phone]: [phone]    This note has been created with Dragon speech recognition software and Paediatric nurse. Any transcriptional errors are unintentional.

## 2018-08-29 NOTE — Progress Notes (Deleted)
New Patient Office Visit  Subjective:  Patient ID: Jacqueline Juarez, female    DOB: [birth year]  Age: 38 y.o. MRN: [id number]  CC:  Chief Complaint  Patient presents with  . Establish Care    HPI Jacqueline Juarez is a 38 year old African American female that presents today to establish care and complaints of fatigue. She was previously followed at [facility]. She endorses that she has been anemic in the past, and is concerned that this is the cause of her fatigue. She endorses that the fatigue has been present over the last month. She has a past medical history of hypertension and lower leg edema, which she is currently compliant with her chlorthalidone. She denies any chest pain, palpitations, or shortness of breath.  Past Medical History:  Diagnosis Date  . Anxiety    At age 5  . Arthritis Dx 2014  . Chronic back pain   . High cholesterol Dx 2014  . Hypertension DX 2014  . Lumbar radiculopathy Dx 2015  . Panic    At age 89  . Paresthesias    left hand, bilat feet  . Peripheral neuropathy    left hand    Past Surgical History:  Procedure Laterality Date  . NO PAST SURGERIES      Family History  Problem Relation Age of Onset  . Arthritis Mother   . Hypertension Mother   . Arthritis Father     Social History   Socioeconomic History  . Marital status: Single    Spouse name: Not on file  . Number of children: 2  . Years of education: 9th  . Highest education level: Not on file  Occupational History    Comment: disabled  Social Needs  . Financial resource strain: Not on file  . Food insecurity:    Worry: Not on file    Inability: Not on file  . Transportation needs:    Medical: Not on file    Non-medical: Not on file  Tobacco Use  . Smoking status: Never Smoker  . Smokeless tobacco: Never Used  Substance and Sexual Activity  . Alcohol use: Not Currently  . Drug use: No  . Sexual activity: Not on  file  Lifestyle  . Physical activity:    Days per week: Not on file    Minutes per session: Not on file  . Stress: Not on file  Relationships  . Social connections:    Talks on phone: Not on file    Gets together: Not on file    Attends religious service: Not on file    Active member of club or organization: Not on file    Attends meetings of clubs or organizations: Not on file    Relationship status: Not on file  . Intimate partner violence:    Fear of current or ex partner: Not on file    Emotionally abused: Not on file    Physically abused: Not on file    Forced sexual activity: Not on file  Other Topics Concern  . Not on file  Social History Narrative   Patient is single and lives at home with her children.    Patient is disabled.   Education 9 th grade.   Right handed.   Caffeine sweet tea two bottles daily.    ROS Review of Systems  Constitutional: Positive for activity change and fatigue. Negative for appetite change, chills, diaphoresis, fever and  unexpected weight change.  Respiratory: Negative for cough, chest tightness, shortness of breath and wheezing.   Cardiovascular: Positive for leg swelling. Negative for chest pain and palpitations.  Genitourinary: Positive for frequency and menstrual problem. Negative for dysuria and flank pain.       Endorses last menstrual cycle was 5 days long, followed by 1 week with no cycle and then another 5 day cycle and reports this is chronic occurring about 3 times out of the year and has been present since her 20's.  Neurological: Negative for dizziness, light-headedness, numbness and headaches.    Objective:   Today's Vitals: BP 133/87   Pulse 91   Temp 98.1 F (36.7 C) (Oral)   Resp 17   Ht 5' 4.5" (1.638 m)   Wt 211 lb 3.2 oz (95.8 kg)   SpO2 96%   BMI 35.69 kg/m   Physical Exam Constitutional:      General: She is not in acute distress.    Appearance: Normal appearance. She is not ill-appearing or  toxic-appearing.  Eyes:     Extraocular Movements: Extraocular movements intact.     Conjunctiva/sclera: Conjunctivae normal.     Pupils: Pupils are equal, round, and reactive to light.  Cardiovascular:     Rate and Rhythm: Normal rate and regular rhythm.     Pulses: Normal pulses.     Heart sounds: Normal heart sounds. No murmur.     Comments: Trace edema in ankles non pitting  Pulmonary:     Effort: Pulmonary effort is normal.     Breath sounds: Normal breath sounds. No wheezing or rhonchi.  Musculoskeletal:     Right lower leg: Edema present.     Left lower leg: Edema present.  Neurological:     Mental Status: She is alert and oriented to person, place, and time.  Psychiatric:        Mood and Affect: Mood normal.        Thought Content: Thought content normal.     Assessment & Plan:   Problem List Items Addressed This Visit      Cardiovascular and Mediastinum   HTN (hypertension) (Chronic)   Relevant Medications   chlorthalidone (HYGROTON) 25 MG tablet   Other Relevant Orders   Urine Culture    Other Visit Diagnoses    Encounter to establish care    -  Primary   Screening for blood or protein in urine       Relevant Orders   POCT URINALYSIS DIP (CLINITEK) (Completed)   POCT urine pregnancy (Completed)   Glucose found in urine on examination       Relevant Orders   Comprehensive metabolic panel   Hemoglobin A1c   Other fatigue       Relevant Orders   CBC with Differential   Acute cystitis with hematuria       Anemia, unspecified type       Relevant Orders   Iron, TIBC and Ferritin Panel      Outpatient Encounter Medications as of 08/29/2018  Medication Sig  . chlorthalidone (HYGROTON) 25 MG tablet Take 1 tablet by mouth daily.  Marland Kitchen gabapentin (NEURONTIN) 300 MG capsule Take 300-600 mg by mouth at bedtime. Take 1 capsule every morning.  Marland Kitchen KLOR-CON M20 20 MEQ tablet Take 20 mEq by mouth 3 (three) times daily.  . Magnesium Gluconate 500 (27 Mg) MG TABS Take 1  tablet by mouth daily.  . nitrofurantoin, macrocrystal-monohydrate, (MACROBID) 100 MG capsule Take 1 capsule (100  mg total) by mouth 2 (two) times daily.  . [DISCONTINUED] potassium chloride (KLOR-CON 10) 10 MEQ tablet Take 1 tablet (10 mEq total) by mouth daily.  . [DISCONTINUED] spironolactone (ALDACTONE) 50 MG tablet Take 1 tablet (50 mg total) by mouth daily.   No facility-administered encounter medications on file as of 08/29/2018.     Follow-up: Return for Fasting labs with PAP -patient's convenience .   Vincent Grosiffany S Deirdre Gryder, RN

## 2018-08-29 NOTE — Patient Instructions (Addendum)
Thank you for choosing Primary Care at [facility] to be your medical home!    Jacqueline Juarez was seen by Molli Barrows, FNP today.   Jacqueline Juarez's primary care provider is Scot Jun, FNP.   For the best care possible, you should try to see Molli Barrows, FNP-C whenever you come to the clinic.   We look forward to seeing you again soon!  If you have any questions about your visit today, please call us at [phone] or feel free to reach your primary care provider via [place].      Fatigue If you have fatigue, you feel tired all the time and have a lack of energy or a lack of motivation. Fatigue may make it difficult to start or complete tasks because of exhaustion. In general, occasional or mild fatigue is often a normal response to activity or life. However, long-lasting (chronic) or extreme fatigue may be a symptom of a medical condition. Follow these instructions at home: General instructions  Watch your fatigue for any changes.  Go to bed and get up at the same time every day.  Avoid fatigue by pacing yourself during the day and getting enough sleep at night.  Maintain a healthy weight. Medicines  Take over-the-counter and prescription medicines only as told by your health care provider.  Take a multivitamin, if told by your health care provider.  Do not use herbal or dietary supplements unless they are approved by your health care provider. Activity   Exercise regularly, as told by your health care provider.  Use or practice techniques to help you relax, such as yoga, tai chi, meditation, or massage therapy. Eating and drinking   Avoid heavy meals in the evening.  Eat a well-balanced diet, which includes lean proteins, whole grains, plenty of fruits and vegetables, and low-fat dairy products.  Avoid consuming too much caffeine.  Avoid the use of alcohol.  Drink enough fluid to keep your urine pale yellow. Lifestyle  Change  situations that cause you stress. Try to keep your work and personal schedule in balance.  Do not use any products that contain nicotine or tobacco, such as cigarettes and e-cigarettes. If you need help quitting, ask your health care provider.  Do not use drugs. Contact a health care provider if:  Your fatigue does not get better.  You have a fever.  You suddenly lose or gain weight.  You have headaches.  You have trouble falling asleep or sleeping through the night.  You feel angry, guilty, anxious, or sad.  You are unable to have a bowel movement (constipation).  Your skin is dry.  You have swelling in your legs or another part of your body. Get help right away if:  You feel confused.  Your vision is blurry.  You feel faint or you pass out.  You have a severe headache.  You have severe pain in your abdomen, your back, or the area between your waist and hips (pelvis).  You have chest pain, shortness of breath, or an irregular or fast heartbeat.  You are unable to urinate, or you urinate less than normal.  You have abnormal bleeding, such as bleeding from the rectum, vagina, nose, lungs, or nipples.  You vomit blood.  You have thoughts about hurting yourself or others. If you ever feel like you may hurt yourself or others, or have thoughts about taking your own life, get help right away. You can go to your nearest emergency department or call:  Your local emergency services (911 in the [place]  A suicide crisis helpline, such as the [facility] at 440-742-1198. This is open 24 hours a day. Summary  If you have fatigue, you feel tired all the time and have a lack of energy or a lack of motivation.  Fatigue may make it difficult to start or complete tasks because of exhaustion.  Long-lasting (chronic) or extreme fatigue may be a symptom of a medical condition.  Exercise regularly, as told by your health care provider.  Change  situations that cause you stress. Try to keep your work and personal schedule in balance. This information is not intended to replace advice given to you by your health care provider. Make sure you discuss any questions you have with your health care provider. Document Released: 04/04/2007 Document Revised: 03/02/2017 Document Reviewed: 03/02/2017 Elsevier Interactive Patient Education  [employer].

## 2018-08-30 LAB — IRON,TIBC AND FERRITIN PANEL
FERRITIN: 33 ng/mL (ref 15–150)
IRON SATURATION: 15 % (ref 15–55)
IRON: 64 ug/dL (ref 27–159)
Total Iron Binding Capacity: 433 ug/dL (ref 250–450)
UIBC: 369 ug/dL (ref 131–425)

## 2018-08-30 LAB — CBC WITH DIFFERENTIAL/PLATELET
BASOS ABS: 0.1 10*3/uL (ref 0.0–0.2)
Basos: 1 %
EOS (ABSOLUTE): 0.1 10*3/uL (ref 0.0–0.4)
Eos: 1 %
HEMOGLOBIN: 13.5 g/dL (ref 11.1–15.9)
Hematocrit: 41.2 % (ref 34.0–46.6)
IMMATURE GRANS (ABS): 0 10*3/uL (ref 0.0–0.1)
Immature Granulocytes: 0 %
LYMPHS: 35 %
Lymphocytes Absolute: 2.5 10*3/uL (ref 0.7–3.1)
MCH: 30.8 pg (ref 26.6–33.0)
MCHC: 32.8 g/dL (ref 31.5–35.7)
MCV: 94 fL (ref 79–97)
Monocytes Absolute: 0.8 10*3/uL (ref 0.1–0.9)
Monocytes: 12 %
NEUTROS ABS: 3.8 10*3/uL (ref 1.4–7.0)
Neutrophils: 51 %
Platelets: 396 10*3/uL (ref 150–450)
RBC: 4.39 x10E6/uL (ref 3.77–5.28)
RDW: 13.3 % (ref 11.7–15.4)
WBC: 7.3 10*3/uL (ref 3.4–10.8)

## 2018-08-30 LAB — COMPREHENSIVE METABOLIC PANEL
ALBUMIN: 4.1 g/dL (ref 3.8–4.8)
ALT: 14 IU/L (ref 0–32)
AST: 18 IU/L (ref 0–40)
Albumin/Globulin Ratio: 1.5 (ref 1.2–2.2)
Alkaline Phosphatase: 60 IU/L (ref 39–117)
BILIRUBIN TOTAL: 1 mg/dL (ref 0.0–1.2)
BUN / CREAT RATIO: 10 (ref 9–23)
BUN: 9 mg/dL (ref 6–24)
CALCIUM: 9.8 mg/dL (ref 8.7–10.2)
CHLORIDE: 99 mmol/L (ref 96–106)
CO2: 25 mmol/L (ref 20–29)
Creatinine, Ser: 0.93 mg/dL (ref 0.57–1.00)
GFR, EST AFRICAN AMERICAN: 89 mL/min/{1.73_m2} (ref >59)
GFR, EST NON AFRICAN AMERICAN: 77 mL/min/{1.73_m2} (ref >59)
GLUCOSE: 83 mg/dL (ref 65–99)
Globulin, Total: 2.8 g/dL (ref 1.5–4.5)
Potassium: 3.4 mmol/L — ABNORMAL LOW (ref 3.5–5.2)
Sodium: 141 mmol/L (ref 134–144)
TOTAL PROTEIN: 6.9 g/dL (ref 6.0–8.5)

## 2018-08-30 LAB — HEMOGLOBIN A1C
Est. average glucose Bld gHb Est-mCnc: 100 mg/dL
Hgb A1c MFr Bld: 5.1 % (ref 4.8–5.6)

## 2018-08-31 MED ORDER — POTASSIUM CHLORIDE CRYS ER 20 MEQ PO TBCR: 20.0000 meq | EXTENDED_RELEASE_TABLET | Freq: Every day | ORAL | 3 refills | Status: DC

## 2018-09-01 LAB — URINE CULTURE

## 2018-09-01 NOTE — Progress Notes (Signed)
Patient notified of results & recommendations. Expressed understanding.

## 2018-09-07 ENCOUNTER — Ambulatory Visit: Payer: Medicaid Other

## 2018-09-18 ENCOUNTER — Ambulatory Visit: Payer: Medicaid Other | Admitting: Family Medicine

## 2018-10-02 ENCOUNTER — Ambulatory Visit: Payer: Medicaid Other

## 2018-11-09 ENCOUNTER — Ambulatory Visit: Payer: Medicaid Other | Admitting: Family Medicine

## 2018-11-21 ENCOUNTER — Ambulatory Visit: Payer: Medicaid Other

## 2019-10-19 ENCOUNTER — Emergency Department (HOSPITAL_COMMUNITY)
Admission: EM | Admit: 2019-10-19 | Discharge: 2019-10-19 | Disposition: A | Discharge status: Home / Self Care | Payer: Medicaid Other | Attending: Emergency Medicine | Admitting: Emergency Medicine

## 2019-10-19 ENCOUNTER — Other Ambulatory Visit: Payer: Self-pay

## 2019-10-19 ENCOUNTER — Encounter (HOSPITAL_COMMUNITY): Payer: Self-pay | Admitting: Emergency Medicine

## 2019-10-19 DIAGNOSIS — I10 Essential (primary) hypertension: Secondary | ICD-10-CM | POA: Insufficient documentation

## 2019-10-19 DIAGNOSIS — Z79899 Other long term (current) drug therapy: Secondary | ICD-10-CM | POA: Insufficient documentation

## 2019-10-19 DIAGNOSIS — M549 Dorsalgia, unspecified: Secondary | ICD-10-CM

## 2019-10-19 DIAGNOSIS — M546 Pain in thoracic spine: Secondary | ICD-10-CM | POA: Insufficient documentation

## 2019-10-19 MED ORDER — METHOCARBAMOL 500 MG PO TABS: 500.0000 mg | ORAL_TABLET | Freq: Two times a day (BID) | ORAL | 0 refills | Status: DC

## 2019-10-19 NOTE — Discharge Instructions (Signed)
Get help right away if you: Have shortness of breath. Have chest pain. Develop numbness or weakness in your legs or arms. Have involuntary loss of urine (urinary incontinence). 

## 2019-10-19 NOTE — ED Triage Notes (Signed)
Pt here from home with c/ o back pain , chronic in nature usually takes naproxen for pain

## 2019-10-19 NOTE — ED Notes (Signed)
ED Provider at bedside. 

## 2019-10-19 NOTE — ED Provider Notes (Signed)
[facility] EMERGENCY DEPARTMENT Provider Note   CSN: [id number] Arrival date & time: 10/19/19  1726     History No chief complaint on file.   Jacqueline Juarez is a 39 y.o. female with a history of chronic thoracic back pain who presents emergency department for midthoracic back pain. Patient is a poor historian. History is limited by the intellectual capacity of the patient, patient's ability to communicate effectively, and overall poor insight. Patient states that she fell forward the other week and since that time has had some pain in her middle back which is worse with leaning forward, trying to sit up straight for long periods of time, twisting, deep breathing.  HPI     Past Medical History:  Diagnosis Date  . Anxiety    At age 26  . Arthritis Dx 2014  . Chronic back pain   . High cholesterol Dx 2014  . Hypertension DX 2014  . Lumbar radiculopathy Dx 2015  . Panic    At age 43  . Paresthesias    left hand, bilat feet  . Peripheral neuropathy    left hand    Patient Active Problem List   Diagnosis Date Noted  . Bilateral low back pain without sciatica 11/02/2017  . Lumbar radiculopathy 03/23/2017  . Restless leg 11/24/2016  . Right calf pain 09/25/2015  . Constipation 07/10/2015  . HTN (hypertension) 09/30/2014  . Tinea pedis 09/30/2014  . Onychomycosis of toenail 09/30/2014  . Intertrigo 09/30/2014  . Abnormality of gait 07/02/2014  . Low back pain 06/06/2014  . Paresthesia 06/06/2014    Past Surgical History:  Procedure Laterality Date  . NO PAST SURGERIES       OB History   No obstetric history on file.     Family History  Problem Relation Age of Onset  . Arthritis Mother   . Hypertension Mother   . Arthritis Father     Social History   Tobacco Use  . Smoking status: Never Smoker  . Smokeless tobacco: Never Used  Substance Use Topics  . Alcohol use: Not Currently  . Drug use: No    Home Medications Prior to  Admission medications   Medication Sig Start Date End Date Taking? Authorizing Provider  chlorthalidone (HYGROTON) 25 MG tablet Take 1 tablet by mouth daily. 08/07/18   [provider]  gabapentin (NEURONTIN) 300 MG capsule Take 300-600 mg by mouth at bedtime. Take 1 capsule every morning.    [provider]  Magnesium Gluconate 500 (27 Mg) MG TABS Take 1 tablet by mouth daily. 08/07/18   [provider]  methocarbamol (ROBAXIN) 500 MG tablet Take 1 tablet (500 mg total) by mouth 2 (two) times daily. 10/19/19   Arthor Captain, PA-C  nitrofurantoin, macrocrystal-monohydrate, (MACROBID) 100 MG capsule Take 1 capsule (100 mg total) by mouth 2 (two) times daily. 08/29/18   Bing Neighbors, FNP  potassium chloride SA (K-DUR,KLOR-CON) 20 MEQ tablet Take 1 tablet (20 mEq total) by mouth daily. 08/31/18   Bing Neighbors, FNP    Allergies    Penicillins  Review of Systems   Review of Systems  Respiratory: Negative for cough.   Musculoskeletal: Positive for back pain.  Neurological: Negative for weakness and numbness.    Physical Exam Updated Vital Signs BP (!) 152/75   Pulse 78   Temp 99.3 F (37.4 C) (Oral)   Resp 18   Ht 5\' 4"  (1.626 m)   Wt 96.2 kg  SpO2 98%   BMI 36.39 kg/m   Physical Exam Vitals and nursing note reviewed.  Constitutional:      General: She is not in acute distress.    Appearance: She is well-developed. She is not diaphoretic.  HENT:     Head: Normocephalic and atraumatic.  Eyes:     General: No scleral icterus.    Conjunctiva/sclera: Conjunctivae normal.  Cardiovascular:     Rate and Rhythm: Normal rate and regular rhythm.     Heart sounds: Normal heart sounds. No murmur. No friction rub. No gallop.   Pulmonary:     Effort: Pulmonary effort is normal. No respiratory distress.     Breath sounds: Normal breath sounds.  Abdominal:     General: Bowel sounds are normal. There is no distension.     Palpations: Abdomen is soft.  There is no mass.     Tenderness: There is no abdominal tenderness. There is no guarding.  Musculoskeletal:     Cervical back: Normal range of motion.     Comments: Patient with mid thoracic paraspinal tenderness and spasm.  She has large breasts with very poor bra support.  When she manually lifts the breast she has significant relief of the pain in her mid back.  No midline spinal tenderness, full range of motion, normal upper extremity strength  Skin:    General: Skin is warm and dry.  Neurological:     Mental Status: She is alert and oriented to person, place, and time.  Psychiatric:        Behavior: Behavior normal.     ED Results / Procedures / Treatments   Labs (all labs ordered are listed, but only abnormal results are displayed) Labs Reviewed - No data to display  EKG None  Radiology No results found.  Procedures Procedures (including critical care time)  Medications Ordered in ED Medications - No data to display  ED Course  I have reviewed the triage vital signs and the nursing notes.  Pertinent labs & imaging results that were available during my care of the patient were reviewed by me and considered in my medical decision making (see chart for details).    MDM Rules/Calculators/A&P                      Patient with midthoracic back pain.  Largely chronic and likely postural.  I have recommended that she continue taking naproxen naproxen she was prescribed by her PCP last week, apply lidocaine Derm patch if she chooses.  Have ordered a muscle relaxer.  She should follow-up with her PCP for physical therapy referral.  She appears otherwise appropriate for discharge at this time.  I doubt any emergent cause of her pain. Final Clinical Impression(s) / ED Diagnoses Final diagnoses:  Mid-back pain, acute    Rx / DC Orders ED Discharge Orders         Ordered    methocarbamol (ROBAXIN) 500 MG tablet  2 times daily     10/19/19 1808           Margarita Mail, PA-C 10/19/19 [place], [place], DO 10/20/19 1202

## 2019-12-12 ENCOUNTER — Emergency Department (HOSPITAL_COMMUNITY)
Admission: EM | Admit: 2019-12-12 | Discharge: 2019-12-12 | Disposition: A | Discharge status: Home / Self Care | Payer: Medicaid Other | Attending: Emergency Medicine | Admitting: Emergency Medicine

## 2019-12-12 ENCOUNTER — Encounter (HOSPITAL_COMMUNITY): Payer: Self-pay | Admitting: Emergency Medicine

## 2019-12-12 ENCOUNTER — Other Ambulatory Visit: Payer: Self-pay

## 2019-12-12 DIAGNOSIS — L509 Urticaria, unspecified: Secondary | ICD-10-CM | POA: Insufficient documentation

## 2019-12-12 DIAGNOSIS — R21 Rash and other nonspecific skin eruption: Secondary | ICD-10-CM | POA: Diagnosis present

## 2019-12-12 DIAGNOSIS — Z79899 Other long term (current) drug therapy: Secondary | ICD-10-CM | POA: Diagnosis not present

## 2019-12-12 DIAGNOSIS — I10 Essential (primary) hypertension: Secondary | ICD-10-CM | POA: Diagnosis not present

## 2019-12-12 MED ORDER — PREDNISONE 20 MG PO TABS: ORAL_TABLET | ORAL | 0 refills | Status: DC

## 2019-12-12 MED ORDER — HYDROXYZINE HCL 25 MG PO TABS: 25.0000 mg | ORAL_TABLET | Freq: Four times a day (QID) | ORAL | 0 refills | Status: DC

## 2019-12-12 NOTE — ED Triage Notes (Signed)
Patient arrives to ED triage from [facility] with complaints of rash on patients back, arms and legs since Monday. The rash is red and raised. Patients airway is clear and pt has tried Cortizone creme without relief, no benadryl. Patient NAD.

## 2019-12-12 NOTE — ED Provider Notes (Signed)
[facility] EMERGENCY DEPARTMENT Provider Note   CSN: [id number] Arrival date & time: 12/12/19  1448     History Chief Complaint  Patient presents with  . Rash    Jacqueline Juarez is a 39 y.o. female.  Patient is a 39 year old female who presents with a rash.  She reports a 3-day history of itchy rash that is on her arms and legs.  She has been using some cortisone cream with no improvement in symptoms.  She has no new exposures.  No change in medications.  She says she ate some fish before it started but she is eaten that same fish before.  She has no airway swelling.  No shortness of breath.  No wheezing.  No history of similar symptoms in the past.        Past Medical History:  Diagnosis Date  . Anxiety    At age 20  . Arthritis Dx 2014  . Chronic back pain   . High cholesterol Dx 2014  . Hypertension DX 2014  . Lumbar radiculopathy Dx 2015  . Panic    At age 66  . Paresthesias    left hand, bilat feet  . Peripheral neuropathy    left hand    Patient Active Problem List   Diagnosis Date Noted  . Bilateral low back pain without sciatica 11/02/2017  . Lumbar radiculopathy 03/23/2017  . Restless leg 11/24/2016  . Right calf pain 09/25/2015  . Constipation 07/10/2015  . HTN (hypertension) 09/30/2014  . Tinea pedis 09/30/2014  . Onychomycosis of toenail 09/30/2014  . Intertrigo 09/30/2014  . Abnormality of gait 07/02/2014  . Low back pain 06/06/2014  . Paresthesia 06/06/2014    Past Surgical History:  Procedure Laterality Date  . NO PAST SURGERIES       OB History   No obstetric history on file.     Family History  Problem Relation Age of Onset  . Arthritis Mother   . Hypertension Mother   . Arthritis Father     Social History   Tobacco Use  . Smoking status: Never Smoker  . Smokeless tobacco: Never Used  Vaping Use  . Vaping Use: Never used  Substance Use Topics  . Alcohol use: Not Currently  . Drug use: No     Home Medications Prior to Admission medications   Medication Sig Start Date End Date Taking? Authorizing Provider  chlorthalidone (HYGROTON) 25 MG tablet Take 1 tablet by mouth daily. 08/07/18   [provider]  gabapentin (NEURONTIN) 300 MG capsule Take 300-600 mg by mouth at bedtime. Take 1 capsule every morning.    [provider]  hydrOXYzine (ATARAX/VISTARIL) 25 MG tablet Take 1 tablet (25 mg total) by mouth every 6 (six) hours. 12/12/19   Malvin Johns, MD  Magnesium Gluconate 500 (27 Mg) MG TABS Take 1 tablet by mouth daily. 08/07/18   [provider]  methocarbamol (ROBAXIN) 500 MG tablet Take 1 tablet (500 mg total) by mouth 2 (two) times daily. 10/19/19   Margarita Mail, PA-C  nitrofurantoin, macrocrystal-monohydrate, (MACROBID) 100 MG capsule Take 1 capsule (100 mg total) by mouth 2 (two) times daily. 08/29/18   Scot Jun, FNP  potassium chloride SA (K-DUR,KLOR-CON) 20 MEQ tablet Take 1 tablet (20 mEq total) by mouth daily. 08/31/18   Scot Jun, FNP  predniSONE (DELTASONE) 20 MG tablet 3 tabs po day one, then 2 tabs daily x 4 days 12/12/19   Malvin Johns, MD  Allergies    Penicillins  Review of Systems   Review of Systems  Constitutional: Negative for chills, diaphoresis, fatigue and fever.  HENT: Negative for congestion, rhinorrhea and sneezing.   Eyes: Negative.   Respiratory: Negative for cough, chest tightness and shortness of breath.   Cardiovascular: Negative for chest pain and leg swelling.  Gastrointestinal: Negative for abdominal pain, blood in stool, diarrhea, nausea and vomiting.  Genitourinary: Negative for difficulty urinating, flank pain, frequency and hematuria.  Musculoskeletal: Negative for arthralgias and back pain.  Skin: Positive for rash.  Neurological: Negative for dizziness, speech difficulty, weakness, numbness and headaches.    Physical Exam Updated Vital Signs BP 138/82 (BP Location: Right Arm)    Pulse 63   Temp 98.2 F (36.8 C)   Resp 17   Ht 5\' 1"  (1.549 m)   Wt 96.2 kg   SpO2 96%   BMI 40.06 kg/m   Physical Exam Constitutional:      Appearance: She is well-developed.  HENT:     Head: Normocephalic and atraumatic.  Eyes:     Pupils: Pupils are equal, round, and reactive to light.  Cardiovascular:     Rate and Rhythm: Normal rate and regular rhythm.     Heart sounds: Normal heart sounds.  Pulmonary:     Effort: Pulmonary effort is normal. No respiratory distress.     Breath sounds: Normal breath sounds. No wheezing or rales.  Chest:     Chest wall: No tenderness.  Abdominal:     General: Bowel sounds are normal.     Palpations: Abdomen is soft.     Tenderness: There is no abdominal tenderness. There is no guarding or rebound.  Musculoskeletal:        General: Normal range of motion.     Cervical back: Normal range of motion and neck supple.  Lymphadenopathy:     Cervical: No cervical adenopathy.  Skin:    General: Skin is warm and dry.     Findings: Rash (Raised maculopapular rash on her arms.  It is blanching.  There is no petechiae or purpura, no vesicles.) present.  Neurological:     Mental Status: She is alert and oriented to person, place, and time.     ED Results / Procedures / Treatments   Labs (all labs ordered are listed, but only abnormal results are displayed) Labs Reviewed - No data to display  EKG None  Radiology No results found.  Procedures Procedures (including critical care time)  Medications Ordered in ED Medications - No data to display  ED Course  I have reviewed the triage vital signs and the nursing notes.  Pertinent labs & imaging results that were available during my care of the patient were reviewed by me and considered in my medical decision making (see chart for details).    MDM Rules/Calculators/A&P                          Patient presents with a urticarial type rash.  No airway involvement.  No known exposures.   She was given prescription for prednisone and Atarax.  She was advised to follow-up with her PCP if her symptoms are improving.  Return precautions were given. Final Clinical Impression(s) / ED Diagnoses Final diagnoses:  Hives    Rx / DC Orders ED Discharge Orders         Ordered    predniSONE (DELTASONE) 20 MG tablet     Discontinue  Reprint     12/12/19 2009    hydrOXYzine (ATARAX/VISTARIL) 25 MG tablet  Every 6 hours     Discontinue  Reprint     12/12/19 2009           Rolan Bucco, MD 12/12/19 2015

## 2020-03-07 ENCOUNTER — Ambulatory Visit
Admission: EM | Admit: 2020-03-07 | Discharge: 2020-03-07 | Disposition: A | Discharge status: Home / Self Care | Payer: Medicaid Other | Attending: Physician Assistant | Admitting: Physician Assistant

## 2020-03-07 DIAGNOSIS — Z20822 Contact with and (suspected) exposure to covid-19: Secondary | ICD-10-CM | POA: Diagnosis not present

## 2020-03-07 DIAGNOSIS — Z0189 Encounter for other specified special examinations: Secondary | ICD-10-CM

## 2020-03-07 NOTE — Discharge Instructions (Signed)

## 2020-03-07 NOTE — ED Triage Notes (Signed)
Patient states that she was exposed to Covid through her brother and would like to have testing.

## 2020-03-10 LAB — NOVEL CORONAVIRUS, NAA: SARS-CoV-2, NAA: NOT DETECTED

## 2020-03-11 ENCOUNTER — Telehealth: Payer: Self-pay | Admitting: Family Medicine

## 2020-03-11 NOTE — Telephone Encounter (Signed)
Called and LVM to return call and schedule a new patient appt,    Copied from CRM [id number]. Topic: Quick Communication - See Telephone Encounter >> Mar 11, 2020 12:07 PM Aretta Nip wrote: CRM for notification. See Telephone encounter for: 03/11/20.pt called while office was at lunch wanting to reestablish care with [facility], pt has reestablished before and has repeated no shows, currently with another provider, wanting a cb to pt if Alvis Lemmings is ok to reestablish again  pt at [phone]

## 2020-05-21 ENCOUNTER — Other Ambulatory Visit: Payer: Self-pay

## 2020-05-21 ENCOUNTER — Ambulatory Visit: Payer: Medicaid Other | Attending: Nurse Practitioner | Admitting: Nurse Practitioner

## 2020-06-04 ENCOUNTER — Other Ambulatory Visit: Payer: Self-pay

## 2020-06-04 ENCOUNTER — Ambulatory Visit
Admission: EM | Admit: 2020-06-04 | Discharge: 2020-06-04 | Disposition: A | Discharge status: Home / Self Care | Payer: Medicaid Other | Attending: Emergency Medicine | Admitting: Emergency Medicine

## 2020-06-04 ENCOUNTER — Ambulatory Visit: Payer: Self-pay

## 2020-06-04 DIAGNOSIS — M79605 Pain in left leg: Secondary | ICD-10-CM | POA: Diagnosis not present

## 2020-06-04 DIAGNOSIS — M79604 Pain in right leg: Secondary | ICD-10-CM

## 2020-06-04 MED ORDER — TIZANIDINE HCL 2 MG PO TABS: 2.0000 mg | ORAL_TABLET | Freq: Four times a day (QID) | ORAL | 0 refills | Status: DC | PRN

## 2020-06-04 MED ORDER — NAPROXEN 500 MG PO TABS: 500.0000 mg | ORAL_TABLET | Freq: Two times a day (BID) | ORAL | 0 refills | Status: DC

## 2020-06-04 NOTE — Discharge Instructions (Signed)
Naprosyn twice daily as needed for leg pain May supplement with tizanidine which is a muscle relaxer as needed for back pain or any spasming in neck/back Follow-up with primary care

## 2020-06-04 NOTE — ED Triage Notes (Signed)
Patient states she has had leg pain x 2 weeks and has had multiple MRIs and was told they couldn't do anything. Pt is aox4 and ambulates with a slight limp. Pt also has not had her bp meds for 2 weeks.

## 2020-06-04 NOTE — ED Notes (Signed)
No answer from waiting area 

## 2020-06-04 NOTE — ED Provider Notes (Signed)
[facility]    CSN: [id number] Arrival date & time: 06/04/20  1115      History   Chief Complaint Chief Complaint  Patient presents with   Leg Pain    X 2 weeks    HPI Jacqueline Juarez is a 39 y.o. female presenting today for evaluation of leg pain. Reports bilateral leg pain and cramping especially when on feet for extended periods of time.  She denies any fall or injury.  Reports history of chronic back pain.  Denies pain at current or with rest.  She has had swelling at times, but denies currently.  Not currently taking any medicines for symptoms port reports that she has previously been prescribed medicine for her back, but does not know what medicine this is.  Has had multiple MRIs of back. HPI  Past Medical History:  Diagnosis Date   Anxiety    At age 57   Arthritis Dx 2014   Chronic back pain    High cholesterol Dx 2014   Hypertension DX 2014   Lumbar radiculopathy Dx 2015   Panic    At age 64   Paresthesias    left hand, bilat feet   Peripheral neuropathy    left hand    Patient Active Problem List   Diagnosis Date Noted   Bilateral low back pain without sciatica 11/02/2017   Lumbar radiculopathy 03/23/2017   Restless leg 11/24/2016   Right calf pain 09/25/2015   Constipation 07/10/2015   HTN (hypertension) 09/30/2014   Tinea pedis 09/30/2014   Onychomycosis of toenail 09/30/2014   Intertrigo 09/30/2014   Abnormality of gait 07/02/2014   Low back pain 06/06/2014   Paresthesia 06/06/2014    Past Surgical History:  Procedure Laterality Date   NO PAST SURGERIES      OB History   No obstetric history on file.      Home Medications    Prior to Admission medications   Medication Sig Start Date End Date Taking? Authorizing Provider  chlorthalidone (HYGROTON) 25 MG tablet Take 1 tablet by mouth daily. 08/07/18  Yes [provider]  gabapentin (NEURONTIN) 300 MG capsule Take 300-600 mg by mouth at  bedtime. Take 1 capsule every morning.   Yes [provider]  potassium chloride SA (K-DUR,KLOR-CON) 20 MEQ tablet Take 1 tablet (20 mEq total) by mouth daily. 08/31/18  Yes Bing Neighbors, FNP  hydrOXYzine (ATARAX/VISTARIL) 25 MG tablet Take 1 tablet (25 mg total) by mouth every 6 (six) hours. 12/12/19   Rolan Bucco, MD  Magnesium Gluconate 500 (27 Mg) MG TABS Take 1 tablet by mouth daily. 08/07/18   [provider]  methocarbamol (ROBAXIN) 500 MG tablet Take 1 tablet (500 mg total) by mouth 2 (two) times daily. 10/19/19   Harris, Cammy Copa, PA-C  naproxen (NAPROSYN) 500 MG tablet Take 1 tablet (500 mg total) by mouth 2 (two) times daily. 06/04/20   Deaunna Olarte C, PA-C  tiZANidine (ZANAFLEX) 2 MG tablet Take 1-2 tablets (2-4 mg total) by mouth every 6 (six) hours as needed for muscle spasms. 06/04/20   Taj Nevins, Junius Creamer, PA-C    Family History Family History  Problem Relation Age of Onset   Arthritis Mother    Hypertension Mother    Arthritis Father     Social History Social History   Tobacco Use   Smoking status: Never Smoker   Smokeless tobacco: Never Used  Building services engineer Use: Never used  Substance Use Topics  Alcohol use: Not Currently   Drug use: No     Allergies   Penicillins   Review of Systems Review of Systems  Constitutional: Negative for fatigue and fever.  Eyes: Negative for visual disturbance.  Respiratory: Negative for shortness of breath.   Cardiovascular: Negative for chest pain.  Gastrointestinal: Negative for abdominal pain, nausea and vomiting.  Musculoskeletal: Positive for back pain, gait problem and myalgias. Negative for arthralgias and joint swelling.  Skin: Negative for color change, rash and wound.  Neurological: Negative for dizziness, weakness, light-headedness and headaches.     Physical Exam Triage Vital Signs ED Triage Vitals  Enc Vitals Group     BP 06/04/20 1202 126/68     Pulse Rate 06/04/20  1202 74     Resp 06/04/20 1202 18     Temp 06/04/20 1202 98.1 F (36.7 C)     Temp Source 06/04/20 1202 Oral     SpO2 06/04/20 1202 99 %     Weight --      Height --      Head Circumference --      Peak Flow --      Pain Score 06/04/20 1205 6     Pain Loc --      Pain Edu? --      Excl. in GC? --    No data found.  Updated Vital Signs BP 126/68 (BP Location: Left Arm)    Pulse 74    Temp 98.1 F (36.7 C) (Oral)    Resp 18    LMP 05/10/2020 (Approximate)    SpO2 99%   Visual Acuity Right Eye Distance:   Left Eye Distance:   Bilateral Distance:    Right Eye Near:   Left Eye Near:    Bilateral Near:     Physical Exam Vitals and nursing note reviewed.  Constitutional:      Appearance: She is well-developed and well-nourished.     Comments: No acute distress  HENT:     Head: Normocephalic and atraumatic.     Nose: Nose normal.  Eyes:     Conjunctiva/sclera: Conjunctivae normal.  Cardiovascular:     Rate and Rhythm: Normal rate.  Pulmonary:     Effort: Pulmonary effort is normal. No respiratory distress.  Abdominal:     General: There is no distension.  Musculoskeletal:        General: Normal range of motion.     Cervical back: Neck supple.     Comments: Bilateral lower legs nontender to palpation, no obvious swelling noted, strength 5/5 and equal bilaterally at hips and knees, patellar reflex 2+ bilaterally  Skin:    General: Skin is warm and dry.  Neurological:     Mental Status: She is alert and oriented to person, place, and time.  Psychiatric:        Mood and Affect: Mood and affect normal.      UC Treatments / Results  Labs (all labs ordered are listed, but only abnormal results are displayed) Labs Reviewed - No data to display  EKG   Radiology No results found.  Procedures Procedures (including critical care time)  Medications Ordered in UC Medications - No data to display  Initial Impression / Assessment and Plan / UC Course  I have  reviewed the triage vital signs and the nursing notes.  Pertinent labs & imaging results that were available during my care of the patient were reviewed by me and considered in my medical decision making (  see chart for details).     Currently without pain, no mechanism of injury, do not suspect acute bony abnormality.  Will provide Naprosyn to use as needed along with muscle relaxers to supplement for pain.  Recommended to follow-up with primary care for further evaluation of chronic pain.  Discussed strict return precautions. Patient verbalized understanding and is agreeable with plan.  Final Clinical Impressions(s) / UC Diagnoses   Final diagnoses:  Pain in both lower extremities     Discharge Instructions     Naprosyn twice daily as needed for leg pain May supplement with tizanidine which is a muscle relaxer as needed for back pain or any spasming in neck/back Follow-up with primary care    ED Prescriptions    Medication Sig Dispense Auth. Provider   naproxen (NAPROSYN) 500 MG tablet Take 1 tablet (500 mg total) by mouth 2 (two) times daily. 30 tablet Danyon Mcginness C, PA-C   tiZANidine (ZANAFLEX) 2 MG tablet Take 1-2 tablets (2-4 mg total) by mouth every 6 (six) hours as needed for muscle spasms. 30 tablet Darryn Kydd, Ignacio C, PA-C     PDMP not reviewed this encounter.   Lew Dawes, PA-C 06/04/20 1308

## 2020-08-13 ENCOUNTER — Other Ambulatory Visit: Payer: Self-pay

## 2020-08-13 ENCOUNTER — Encounter: Payer: Self-pay | Admitting: Nurse Practitioner

## 2020-08-13 ENCOUNTER — Ambulatory Visit: Payer: Medicaid Other | Attending: Nurse Practitioner | Admitting: Nurse Practitioner

## 2020-08-13 VITALS — BP 120/82 | HR 71 | Temp 97.8°F | Ht 64.5 in | Wt 231.0 lb

## 2020-08-13 DIAGNOSIS — I10 Essential (primary) hypertension: Secondary | ICD-10-CM

## 2020-08-13 DIAGNOSIS — F32A Depression, unspecified: Secondary | ICD-10-CM

## 2020-08-13 DIAGNOSIS — Z23 Encounter for immunization: Secondary | ICD-10-CM

## 2020-08-13 DIAGNOSIS — E785 Hyperlipidemia, unspecified: Secondary | ICD-10-CM

## 2020-08-13 DIAGNOSIS — F419 Anxiety disorder, unspecified: Secondary | ICD-10-CM

## 2020-08-13 DIAGNOSIS — Z1231 Encounter for screening mammogram for malignant neoplasm of breast: Secondary | ICD-10-CM

## 2020-08-13 DIAGNOSIS — E876 Hypokalemia: Secondary | ICD-10-CM

## 2020-08-13 DIAGNOSIS — R7309 Other abnormal glucose: Secondary | ICD-10-CM

## 2020-08-13 DIAGNOSIS — M5416 Radiculopathy, lumbar region: Secondary | ICD-10-CM

## 2020-08-13 DIAGNOSIS — Z1159 Encounter for screening for other viral diseases: Secondary | ICD-10-CM

## 2020-08-13 DIAGNOSIS — Z7689 Persons encountering health services in other specified circumstances: Secondary | ICD-10-CM

## 2020-08-13 DIAGNOSIS — D649 Anemia, unspecified: Secondary | ICD-10-CM

## 2020-08-13 MED ORDER — CHLORTHALIDONE 25 MG PO TABS: 12.5000 mg | ORAL_TABLET | Freq: Every day | ORAL | 2 refills | Status: DC

## 2020-08-13 MED ORDER — HYDROXYZINE HCL 25 MG PO TABS: 25.0000 mg | ORAL_TABLET | Freq: Four times a day (QID) | ORAL | 1 refills | Status: DC

## 2020-08-13 MED ORDER — GABAPENTIN 300 MG PO CAPS: ORAL_CAPSULE | ORAL | 3 refills | Status: DC

## 2020-08-13 MED ORDER — POTASSIUM CHLORIDE CRYS ER 20 MEQ PO TBCR: 20.0000 meq | EXTENDED_RELEASE_TABLET | Freq: Every day | ORAL | 3 refills | Status: DC

## 2020-08-13 MED ORDER — DULOXETINE HCL 30 MG PO CPEP: 30.0000 mg | ORAL_CAPSULE | Freq: Every day | ORAL | 3 refills | Status: DC

## 2020-08-13 NOTE — Progress Notes (Signed)
Assessment & Plan:  Rudell was seen today for establish care.  Diagnoses and all orders for this visit:  Encounter to establish care  Essential hypertension -     CMP14+EGFR -     chlorthalidone (HYGROTON) 25 MG tablet; Take 0.5 tablets (12.5 mg total) by mouth daily. Continue all antihypertensives as prescribed.  Remember to bring in your blood pressure log with you for your follow up appointment.  DASH/Mediterranean Diets are healthier choices for HTN.    Dyslipidemia, goal LDL below 100 -     Lipid panel INSTRUCTIONS: Work on a low fat, heart healthy diet and participate in regular aerobic exercise program by working out at least 150 minutes per week; 5 days a week-30 minutes per day. Avoid red meat/beef/steak,  fried foods. junk foods, sodas, sugary drinks, unhealthy snacking, alcohol and smoking.  Drink at least 80 oz of water per day and monitor your carbohydrate intake daily.    Hypokalemia -     potassium chloride SA (KLOR-CON) 20 MEQ tablet; Take 1 tablet (20 mEq total) by mouth daily.  Anxiety and depression -     hydrOXYzine (ATARAX/VISTARIL) 25 MG tablet; Take 1 tablet (25 mg total) by mouth every 6 (six) hours. -     Ambulatory referral to Psychiatry -     DULoxetine (CYMBALTA) 30 MG capsule; Take 1 capsule (30 mg total) by mouth daily.  Anemia, unspecified type -     CBC  Elevated glucose level -     Hemoglobin A1c  Breast cancer screening by mammogram -     MM 3D SCREEN BREAST BILATERAL; Future  Need for hepatitis C screening test -     HCV Ab w Reflex to Quant PCR  Need for immunization against influenza -     Flu Vaccine QUAD 36+ mos IM  Lumbar radiculopathy -     gabapentin (NEURONTIN) 300 MG capsule; Take 1 capsule every morning by mouth. Take 2 capsules by mouth at night -     DULoxetine (CYMBALTA) 30 MG capsule; Take 1 capsule (30 mg total) by mouth daily.  Patient has been counseled on age-appropriate routine health concerns for screening and  prevention. These are reviewed and up-to-date. Referrals have been placed accordingly. Immunizations are up-to-date or declined.    Subjective:   Chief Complaint  Patient presents with  . Establish Care    Patient is here to establish care.   HPI Hinley D Damron 40 y.o. female presents to office today to establish care. He has a past medical history of Anxiety, Depression, Mood disorder, Learning disability,  Arthritis (Dx 2014), Chronic back pain, High cholesterol (Dx 2014), Hypertension (DX 2014), Lumbar radiculopathy (Dx 2015), Panic, Paresthesias, and Peripheral neuropathy.  She has seen neurology in the past for neuropathy with no further workup required at that time as pain was likely consistent with musculoskeletal etiology (10-2017). Extensive imaging study and electrodiagnostic study showed no evidence of radiculopathy.  Previous MRI:  mild herniated disc at L5-S1 level with no significant stenosis. Using cane to assist with ambulation. She has tried relafen, gabapentin, NSAIDs, Lidoderm, Robaxin, baclofen. She states physical therapy was ineffective in the past.   .asc  Essential Hypertension She has not been taking chlorthalidone 25 mg for quite some time.  Will restart at 12.63m daily as blood pressure is close to goal. She has taken norvasc and spironolactone in the past. She was taking spirinolactone in the past for HTN, hypokalemia and facial hair.  BP Readings from  Last 3 Encounters:  08/13/20 120/82  06/04/20 126/68  03/07/20 120/87   Anxiety and Depression Will resume cymbalta. She can not recall if she took this consistently in the past. Will also restart hydroxyzine 25 mg q6 hrs prn.  Depression screen [facility] 2/9 08/13/2020 08/29/2018 08/12/2017 03/23/2017 07/26/2016  Decreased Interest 3 1 0 0 0  Down, Depressed, Hopeless 1 0 0 0 0  PHQ - 2 Score 4 1 0 0 0  Altered sleeping 3 0 0 0 0  Tired, decreased energy _0 Change in appetite 0 0 3 2 0  Feeling bad or  failure about yourself  0 0 1 0 0  Trouble concentrating 0 0 1 2 0  Moving slowly or fidgety/restless 0 0 0 1 0  Suicidal thoughts 0 0 0 0 0  PHQ-9 Score _1 GAD 7 : Generalized Anxiety Score 08/13/2020 08/29/2018 08/12/2017 03/23/2017  Nervous, Anxious, on Edge 2 0 0 0  Control/stop worrying 2 0 0 1  Worry too much - different things 2 0 2 0  Trouble relaxing 0 0 2 0  Restless 0 0 0 0  Easily annoyed or irritable 0 0 0 0  Afraid - awful might happen 1 0 0 0  Total GAD 7 Score 7 0 4 1    Review of Systems  Constitutional: Negative for fever, malaise/fatigue and weight loss.  HENT: Negative.  Negative for nosebleeds.   Eyes: Negative.  Negative for blurred vision, double vision and photophobia.  Respiratory: Negative.  Negative for cough and shortness of breath.   Cardiovascular: Negative.  Negative for chest pain, palpitations and leg swelling.  Gastrointestinal: Negative.  Negative for heartburn, nausea and vomiting.  Musculoskeletal: Positive for back pain, joint pain and myalgias.  Neurological: Positive for sensory change. Negative for dizziness, focal weakness, seizures and headaches.  Psychiatric/Behavioral: Positive for depression. Negative for suicidal ideas. The patient is nervous/anxious.     Past Medical History:  Diagnosis Date  . Anxiety    At age 79  . Arthritis Dx 2014  . Chronic back pain   . High cholesterol Dx 2014  . Hypertension DX 2014  . Lumbar radiculopathy Dx 2015  . Panic    At age 66  . Paresthesias    left hand, bilat feet  . Peripheral neuropathy    left hand    Past Surgical History:  Procedure Laterality Date  . NO PAST SURGERIES      Family History  Problem Relation Age of Onset  . Arthritis Mother   . Hypertension Mother   . Arthritis Father     Social History Reviewed with no changes to be made today.   Outpatient Medications Prior to Visit  Medication Sig Dispense Refill  . chlorthalidone (HYGROTON) 25 MG tablet Take  1 tablet by mouth daily.    Marland Kitchen gabapentin (NEURONTIN) 300 MG capsule Take 300-600 mg by mouth at bedtime. Take 1 capsule every morning.    . naproxen (NAPROSYN) 500 MG tablet Take 1 tablet (500 mg total) by mouth 2 (two) times daily. 30 tablet 0  . potassium chloride SA (K-DUR,KLOR-CON) 20 MEQ tablet Take 1 tablet (20 mEq total) by mouth daily. 30 tablet 3  . hydrOXYzine (ATARAX/VISTARIL) 25 MG tablet Take 1 tablet (25 mg total) by mouth every 6 (six) hours. (Patient not taking: Reported on 08/13/2020) 12 tablet 0  . Magnesium Gluconate 500 (27 Mg) MG TABS Take 1  tablet by mouth daily. (Patient not taking: Reported on 08/13/2020)    . methocarbamol (ROBAXIN) 500 MG tablet Take 1 tablet (500 mg total) by mouth 2 (two) times daily. (Patient not taking: Reported on 08/13/2020) 20 tablet 0  . tiZANidine (ZANAFLEX) 2 MG tablet Take 1-2 tablets (2-4 mg total) by mouth every 6 (six) hours as needed for muscle spasms. (Patient not taking: Reported on 08/13/2020) 30 tablet 0   No facility-administered medications prior to visit.    Allergies  Allergen Reactions  . Penicillins Hives    Has patient had a PCN reaction causing immediate rash, facial/tongue/throat swelling, SOB or lightheadedness with hypotension: No Has patient had a PCN reaction causing severe rash involving mucus membranes or skin necrosis: No Has patient had a PCN reaction that required hospitalization No Has patient had a PCN reaction occurring within the last 10 years: No If all of the above answers are "NO", then may proceed with Cephalosporin use.       Objective:    BP 120/82 (BP Location: Left Arm, Patient Position: Sitting, Cuff Size: Large)   Pulse 71   Temp 97.8 F (36.6 C) (Oral)   Ht 5' 4.5" (1.638 m)   Wt 231 lb (104.8 kg)   LMP 07/28/2020   SpO2 99%   BMI 39.04 kg/m  Wt Readings from Last 3 Encounters:  08/13/20 231 lb (104.8 kg)  12/12/19 212 lb (96.2 kg)  10/19/19 212 lb (96.2 kg)    Physical Exam Vitals and  nursing note reviewed.  Constitutional:      Appearance: She is well-developed and well-nourished.  HENT:     Head: Normocephalic and atraumatic.  Eyes:     Extraocular Movements: EOM normal.  Cardiovascular:     Rate and Rhythm: Normal rate and regular rhythm.     Pulses: Intact distal pulses.     Heart sounds: Normal heart sounds. No murmur heard. No friction rub. No gallop.   Pulmonary:     Effort: Pulmonary effort is normal. No tachypnea or respiratory distress.     Breath sounds: Normal breath sounds. No decreased breath sounds, wheezing, rhonchi or rales.  Chest:     Chest wall: No tenderness.  Abdominal:     General: Bowel sounds are normal.     Palpations: Abdomen is soft.  Musculoskeletal:        General: No edema. Normal range of motion.     Cervical back: Normal range of motion.  Skin:    General: Skin is warm and dry.  Neurological:     Mental Status: She is alert and oriented to person, place, and time.     Coordination: Coordination normal.     Gait: Gait abnormal.  Psychiatric:        Mood and Affect: Mood and affect normal.        Behavior: Behavior normal. Behavior is cooperative.        Thought Content: Thought content normal.        Judgment: Judgment normal.        Patient has been counseled extensively about nutrition and exercise as well as the importance of adherence with medications and regular follow-up. The patient was given clear instructions to go to ER or return to medical center if symptoms don't improve, worsen or new problems develop. The patient verbalized understanding.   Follow-up: Return for PAP SMEAR.   Gildardo Pounds, FNP-BC [facility] [place], [place]   08/16/2020, 9:43  PM

## 2020-08-14 LAB — LIPID PANEL
Chol/HDL Ratio: 2.8 ratio (ref 0.0–4.4)
Cholesterol, Total: 230 mg/dL — ABNORMAL HIGH (ref 100–199)
HDL: 81 mg/dL (ref >39)
LDL Chol Calc (NIH): 131 mg/dL — ABNORMAL HIGH (ref 0–99)
Triglycerides: 104 mg/dL (ref 0–149)
VLDL Cholesterol Cal: 18 mg/dL (ref 5–40)

## 2020-08-14 LAB — CMP14+EGFR
ALT: 13 IU/L (ref 0–32)
AST: 14 IU/L (ref 0–40)
Albumin/Globulin Ratio: 1.6 (ref 1.2–2.2)
Albumin: 4 g/dL (ref 3.8–4.8)
Alkaline Phosphatase: 65 IU/L (ref 44–121)
BUN/Creatinine Ratio: 9 (ref 9–23)
BUN: 7 mg/dL (ref 6–24)
Bilirubin Total: 1 mg/dL (ref 0.0–1.2)
CO2: 19 mmol/L — ABNORMAL LOW (ref 20–29)
Calcium: 9 mg/dL (ref 8.7–10.2)
Chloride: 105 mmol/L (ref 96–106)
Creatinine, Ser: 0.74 mg/dL (ref 0.57–1.00)
GFR calc Af Amer: 116 mL/min/{1.73_m2} (ref >59)
GFR calc non Af Amer: 100 mL/min/{1.73_m2} (ref >59)
Globulin, Total: 2.5 g/dL (ref 1.5–4.5)
Glucose: 79 mg/dL (ref 65–99)
Potassium: 3.4 mmol/L — ABNORMAL LOW (ref 3.5–5.2)
Sodium: 140 mmol/L (ref 134–144)
Total Protein: 6.5 g/dL (ref 6.0–8.5)

## 2020-08-14 LAB — CBC
Hematocrit: 37.7 % (ref 34.0–46.6)
Hemoglobin: 12.9 g/dL (ref 11.1–15.9)
MCH: 31.2 pg (ref 26.6–33.0)
MCHC: 34.2 g/dL (ref 31.5–35.7)
MCV: 91 fL (ref 79–97)
Platelets: 287 10*3/uL (ref 150–450)
RBC: 4.14 x10E6/uL (ref 3.77–5.28)
RDW: 13.1 % (ref 11.7–15.4)
WBC: 7.4 10*3/uL (ref 3.4–10.8)

## 2020-08-14 LAB — HCV INTERPRETATION

## 2020-08-14 LAB — HEMOGLOBIN A1C
Est. average glucose Bld gHb Est-mCnc: 85 mg/dL
Hgb A1c MFr Bld: 4.6 % — ABNORMAL LOW (ref 4.8–5.6)

## 2020-08-14 LAB — HCV AB W REFLEX TO QUANT PCR: HCV Ab: <0.1 s/co ratio (ref 0.0–0.9)

## 2020-08-16 ENCOUNTER — Encounter: Payer: Self-pay | Admitting: Nurse Practitioner

## 2020-08-16 MED ORDER — ATORVASTATIN CALCIUM 20 MG PO TABS: 20.0000 mg | ORAL_TABLET | Freq: Every day | ORAL | 3 refills | Status: DC

## 2020-08-19 ENCOUNTER — Telehealth: Payer: Self-pay | Admitting: *Deleted

## 2020-08-19 NOTE — Telephone Encounter (Signed)
Pt had additional questions regarding lab results. Reviewed result note. Pt states she is already taking potassium from "Another doctor I saw." Verified med and dosage several times.  Potassium Chloride SA 20 meq daily. States she has been taking "Few months." Also wished to discuss diet to lower cholesterol; education provided to pts satisfaction.  CB# [phone]

## 2020-08-20 NOTE — Telephone Encounter (Signed)
Patient was instructed to stop chlorthalidone and return for labs next Friday. Repeat potassium

## 2020-08-20 NOTE — Telephone Encounter (Signed)
FYI

## 2020-08-29 ENCOUNTER — Ambulatory Visit: Payer: Medicaid Other | Attending: Nurse Practitioner

## 2020-08-29 ENCOUNTER — Other Ambulatory Visit: Payer: Self-pay

## 2020-08-29 DIAGNOSIS — E876 Hypokalemia: Secondary | ICD-10-CM

## 2020-08-30 LAB — POTASSIUM: Potassium: 4 mmol/L (ref 3.5–5.2)

## 2020-09-01 ENCOUNTER — Other Ambulatory Visit: Payer: Self-pay | Admitting: Nurse Practitioner

## 2020-09-01 DIAGNOSIS — F32A Depression, unspecified: Secondary | ICD-10-CM

## 2020-09-01 DIAGNOSIS — F419 Anxiety disorder, unspecified: Secondary | ICD-10-CM

## 2020-09-01 NOTE — Telephone Encounter (Signed)
   Notes to clinic: Patient requesting a 90 day supply  Requested Prescriptions  Pending Prescriptions Disp Refills   hydrOXYzine (ATARAX/VISTARIL) 25 MG tablet [Pharmacy Med Name: HYDROXYZINE HCL 25 MG TABLET] 360 tablet 1    Sig: TAKE 1 TABLET BY MOUTH EVERY 6 HOURS.      Ear, Nose, and Throat:  Antihistamines Passed - 09/01/2020  8:30 AM      Passed - Valid encounter within last 12 months    Recent Outpatient Visits           2 weeks ago Encounter to establish care   [facility] Brent, Shea Stakes, NP   3 years ago Paresthesia   [facility] Hoy Register, MD   3 years ago Need for influenza vaccination   [facility] Hoy Register, MD   4 years ago Screening for diabetes mellitus (DM)   [facility] Dessa Phi, MD   4 years ago Essential hypertension   [facility] Dessa Phi, MD       Future Appointments             In 1 month Claiborne Rigg, NP [facility]

## 2020-09-05 ENCOUNTER — Other Ambulatory Visit: Payer: Self-pay | Admitting: Nurse Practitioner

## 2020-09-05 DIAGNOSIS — I10 Essential (primary) hypertension: Secondary | ICD-10-CM

## 2020-09-15 ENCOUNTER — Encounter: Payer: Self-pay | Admitting: Emergency Medicine

## 2020-09-15 ENCOUNTER — Ambulatory Visit
Admission: EM | Admit: 2020-09-15 | Discharge: 2020-09-15 | Disposition: A | Discharge status: Home / Self Care | Payer: Medicaid Other | Attending: Emergency Medicine | Admitting: Emergency Medicine

## 2020-09-15 ENCOUNTER — Other Ambulatory Visit: Payer: Self-pay

## 2020-09-15 DIAGNOSIS — J069 Acute upper respiratory infection, unspecified: Secondary | ICD-10-CM | POA: Diagnosis not present

## 2020-09-15 NOTE — ED Triage Notes (Signed)
Onset yesterday of cough, abdominal pain, and runny nose and sore throat

## 2020-09-15 NOTE — ED Provider Notes (Signed)
[facility]    CSN: [id number] Arrival date & time: 09/15/20  1435      History   Chief Complaint Chief Complaint  Patient presents with  . Cough    HPI Jacqueline Juarez is a 40 y.o. female.   Jacqueline Juarez presents with complaints of upper respiratory symptoms like cough and congestion. No shortness of breath . No fevers. No headache or body aches. No nausea vomiting or diarrhea but has felt like she has had more gas. Her daughter has been ill as well. She has been using over the counter medications as needed for her symptoms. No history of asthma and doesn't smoke.     ROS per HPI, negative if not otherwise mentioned.      Past Medical History:  Diagnosis Date  . Anxiety    At age 76  . Arthritis Dx 2014  . Chronic back pain   . High cholesterol Dx 2014  . Hypertension DX 2014  . Lumbar radiculopathy Dx 2015  . Panic    At age 50  . Paresthesias    left hand, bilat feet  . Peripheral neuropathy    left hand    Patient Active Problem List   Diagnosis Date Noted  . Bilateral low back pain without sciatica 11/02/2017  . Lumbar radiculopathy 03/23/2017  . Restless leg 11/24/2016  . Right calf pain 09/25/2015  . Constipation 07/10/2015  . HTN (hypertension) 09/30/2014  . Tinea pedis 09/30/2014  . Onychomycosis of toenail 09/30/2014  . Intertrigo 09/30/2014  . Abnormality of gait 07/02/2014  . Low back pain 06/06/2014  . Paresthesia 06/06/2014    Past Surgical History:  Procedure Laterality Date  . CESAREAN SECTION    . NO PAST SURGERIES      OB History   No obstetric history on file.      Home Medications    Prior to Admission medications   Medication Sig Start Date End Date Taking? Authorizing Provider  atorvastatin (LIPITOR) 20 MG tablet Take 1 tablet (20 mg total) by mouth daily. 08/16/20  Yes Claiborne Rigg, NP  DULoxetine (CYMBALTA) 30 MG capsule Take 1 capsule (30 mg total) by mouth daily. 08/13/20  Yes Claiborne Rigg, NP  gabapentin (NEURONTIN) 300 MG capsule Take 1 capsule every morning by mouth. Take 2 capsules by mouth at night 08/13/20  Yes Claiborne Rigg, NP  hydrOXYzine (ATARAX/VISTARIL) 25 MG tablet TAKE 1 TABLET BY MOUTH EVERY 6 HOURS. 09/02/20  Yes Hoy Register, MD  potassium chloride SA (KLOR-CON) 20 MEQ tablet Take 1 tablet (20 mEq total) by mouth daily. 08/13/20  Yes Claiborne Rigg, NP  chlorthalidone (HYGROTON) 25 MG tablet TAKE 1/2 TABLET BY MOUTH EVERY DAY 09/05/20   Claiborne Rigg, NP    Family History Family History  Problem Relation Age of Onset  . Arthritis Mother   . Hypertension Mother   . Arthritis Father     Social History Social History   Tobacco Use  . Smoking status: Never Smoker  . Smokeless tobacco: Never Used  Vaping Use  . Vaping Use: Never used  Substance Use Topics  . Alcohol use: Not Currently  . Drug use: No     Allergies   Penicillins   Review of Systems Review of Systems   Physical Exam Triage Vital Signs ED Triage Vitals  Enc Vitals Group     BP 09/15/20 1518 118/82     Pulse Rate 09/15/20 1518 82  Resp 09/15/20 1518 (!) 21     Temp 09/15/20 1518 97.9 F (36.6 C)     Temp Source 09/15/20 1518 Oral     SpO2 09/15/20 1518 98 %     Weight --      Height --      Head Circumference --      Peak Flow --      Pain Score 09/15/20 1514 4     Pain Loc --      Pain Edu? --      Excl. in GC? --    No data found.  Updated Vital Signs BP 118/82 (BP Location: Left Arm) Comment (BP Location): large cuff  Pulse 82   Temp 97.9 F (36.6 C) (Oral)   Resp (!) 21   LMP 08/18/2020   SpO2 98%   Visual Acuity Right Eye Distance:   Left Eye Distance:   Bilateral Distance:    Right Eye Near:   Left Eye Near:    Bilateral Near:     Physical Exam Constitutional:      General: She is not in acute distress.    Appearance: She is well-developed.  HENT:     Nose: Rhinorrhea present.     Mouth/Throat:     Mouth: Mucous  membranes are moist.     Pharynx: Oropharynx is clear.  Cardiovascular:     Rate and Rhythm: Normal rate and regular rhythm.  Pulmonary:     Effort: Pulmonary effort is normal.     Breath sounds: Normal breath sounds.  Skin:    General: Skin is warm and dry.  Neurological:     Mental Status: She is alert and oriented to person, place, and time.      UC Treatments / Results  Labs (all labs ordered are listed, but only abnormal results are displayed) Labs Reviewed  NOVEL CORONAVIRUS, NAA    EKG   Radiology No results found.  Procedures Procedures (including critical care time)  Medications Ordered in UC Medications - No data to display  Initial Impression / Assessment and Plan / UC Course  I have reviewed the triage vital signs and the nursing notes.  Pertinent labs & imaging results that were available during my care of the patient were reviewed by me and considered in my medical decision making (see chart for details).     Non toxic. Benign physical exam.  History and physical consistent with viral illness. Covid testing pending and isolation instructions provided.  Supportive cares recommended. Return precautions provided. Patient verbalized understanding and agreeable to plan.   Final Clinical Impressions(s) / UC Diagnoses   Final diagnoses:  Upper respiratory tract infection, unspecified type     Discharge Instructions     Push fluids to ensure adequate hydration and keep secretions thin.  Tylenol and/or ibuprofen as needed for pain or fevers.  Over the counter medications as needed for symptoms.  Self isolate until covid results are back.  We will notify you by phone if it is positive. Your negative results will be sent through your MyChart.    If it is positive you need to isolate from others for a total of 5 days. If no fever for 24 hours without medications, and symptoms improving you may end isolation on day 6, but wear a mask if around any others for  an additional 5 days.   If symptoms worsen or do not improve in the next week to return to be seen or to follow up  with your PCP.     ED Prescriptions    None     PDMP not reviewed this encounter.   Georgetta Haber, NP 09/15/20 1547

## 2020-09-15 NOTE — Discharge Instructions (Signed)
Push fluids to ensure adequate hydration and keep secretions thin.  Tylenol and/or ibuprofen as needed for pain or fevers.  Over the counter medications as needed for symptoms.  Self isolate until covid results are back.  We will notify you by phone if it is positive. Your negative results will be sent through your MyChart.    If it is positive you need to isolate from others for a total of 5 days. If no fever for 24 hours without medications, and symptoms improving you may end isolation on day 6, but wear a mask if around any others for an additional 5 days.   If symptoms worsen or do not improve in the next week to return to be seen or to follow up with your PCP.

## 2020-09-16 LAB — NOVEL CORONAVIRUS, NAA: SARS-CoV-2, NAA: NOT DETECTED

## 2020-09-16 LAB — SARS-COV-2, NAA 2 DAY TAT

## 2020-10-08 ENCOUNTER — Ambulatory Visit: Payer: Medicaid Other

## 2020-10-08 ENCOUNTER — Other Ambulatory Visit (HOSPITAL_COMMUNITY)
Admission: RE | Admit: 2020-10-08 | Discharge: 2020-10-08 | Disposition: A | Discharge status: Home / Self Care | Payer: Medicaid Other | Source: Ambulatory Visit | Attending: Nurse Practitioner | Admitting: Nurse Practitioner

## 2020-10-08 ENCOUNTER — Ambulatory Visit: Payer: Medicaid Other | Attending: Nurse Practitioner | Admitting: Nurse Practitioner

## 2020-10-08 ENCOUNTER — Encounter: Payer: Self-pay | Admitting: Nurse Practitioner

## 2020-10-08 ENCOUNTER — Other Ambulatory Visit: Payer: Self-pay

## 2020-10-08 VITALS — BP 139/87 | HR 84 | Resp 16 | Wt 226.0 lb

## 2020-10-08 DIAGNOSIS — Z114 Encounter for screening for human immunodeficiency virus [HIV]: Secondary | ICD-10-CM

## 2020-10-08 DIAGNOSIS — R143 Flatulence: Secondary | ICD-10-CM

## 2020-10-08 DIAGNOSIS — Z124 Encounter for screening for malignant neoplasm of cervix: Secondary | ICD-10-CM | POA: Diagnosis not present

## 2020-10-08 MED ORDER — SIMETHICONE 80 MG PO CHEW: 80.0000 mg | CHEWABLE_TABLET | Freq: Four times a day (QID) | ORAL | 1 refills | Status: DC | PRN

## 2020-10-08 NOTE — Progress Notes (Signed)
Assessment & Plan:  Jacqueline Juarez was seen today for gynecologic exam.  Diagnoses and all orders for this visit:  Encounter for Papanicolaou smear for cervical cancer screening -     Cytology - PAP -     Cervicovaginal ancillary only  Excessive gas -     simethicone (GAS-X) 80 MG chewable tablet; Chew 1 tablet (80 mg total) by mouth every 6 (six) hours as needed for flatulence.  Encounter for screening for HIV -     HIV antibody (with reflex)    Patient has been counseled on age-appropriate routine health concerns for screening and prevention. These are reviewed and up-to-date. Referrals have been placed accordingly. Immunizations are up-to-date or declined.    Subjective:   Chief Complaint  Patient presents with  . Gynecologic Exam   HPI Jacqueline Juarez 40 y.o. female presents to office today for pap smear   Review of Systems  Constitutional: Negative.  Negative for chills, fever, malaise/fatigue and weight loss.  Respiratory: Negative.  Negative for cough, shortness of breath and wheezing.   Cardiovascular: Negative.  Negative for chest pain, orthopnea and leg swelling.  Gastrointestinal: Negative for abdominal pain.       Excessive gas  Genitourinary: Negative.  Negative for flank pain.  Skin: Negative.  Negative for rash.  Psychiatric/Behavioral: Negative for suicidal ideas.    Past Medical History:  Diagnosis Date  . Anxiety    At age 54  . Arthritis Dx 2014  . Chronic back pain   . High cholesterol Dx 2014  . Hypertension DX 2014  . Lumbar radiculopathy Dx 2015  . Panic    At age 58  . Paresthesias    left hand, bilat feet  . Peripheral neuropathy    left hand    Past Surgical History:  Procedure Laterality Date  . CESAREAN SECTION    . NO PAST SURGERIES      Family History  Problem Relation Age of Onset  . Arthritis Mother   . Hypertension Mother   . Arthritis Father     Social History Reviewed with no changes to be made today.    Outpatient Medications Prior to Visit  Medication Sig Dispense Refill  . atorvastatin (LIPITOR) 20 MG tablet Take 1 tablet (20 mg total) by mouth daily. 90 tablet 3  . chlorthalidone (HYGROTON) 25 MG tablet TAKE 1/2 TABLET BY MOUTH EVERY DAY 45 tablet 0  . DULoxetine (CYMBALTA) 30 MG capsule Take 1 capsule (30 mg total) by mouth daily. 90 capsule 3  . gabapentin (NEURONTIN) 300 MG capsule Take 1 capsule every morning by mouth. Take 2 capsules by mouth at night 90 capsule 3  . hydrOXYzine (ATARAX/VISTARIL) 25 MG tablet TAKE 1 TABLET BY MOUTH EVERY 6 HOURS. 360 tablet 1  . potassium chloride SA (KLOR-CON) 20 MEQ tablet Take 1 tablet (20 mEq total) by mouth daily. 30 tablet 3   No facility-administered medications prior to visit.    Allergies  Allergen Reactions  . Penicillins Hives    Has patient had a PCN reaction causing immediate rash, facial/tongue/throat swelling, SOB or lightheadedness with hypotension: No Has patient had a PCN reaction causing severe rash involving mucus membranes or skin necrosis: No Has patient had a PCN reaction that required hospitalization No Has patient had a PCN reaction occurring within the last 10 years: No If all of the above answers are "NO", then may proceed with Cephalosporin use.       Objective:    BP  139/87   Pulse 84   Resp 16   Wt 226 lb (102.5 kg)   SpO2 100%   BMI 38.19 kg/m  Wt Readings from Last 3 Encounters:  10/08/20 226 lb (102.5 kg)  08/13/20 231 lb (104.8 kg)  12/12/19 212 lb (96.2 kg)    Physical Exam Exam conducted with a chaperone present.  Constitutional:      Appearance: She is well-developed.  HENT:     Head: Normocephalic.  Cardiovascular:     Rate and Rhythm: Normal rate and regular rhythm.     Heart sounds: Normal heart sounds.  Pulmonary:     Effort: Pulmonary effort is normal.     Breath sounds: Normal breath sounds.  Abdominal:     General: Bowel sounds are normal.     Palpations: Abdomen is soft.      Hernia: There is no hernia in the left inguinal area.  Genitourinary:    Exam position: Lithotomy position.     Labia:        Right: No rash, tenderness, lesion or injury.        Left: No rash, tenderness, lesion or injury.      Vagina: Normal. No signs of injury and foreign body. No vaginal discharge, erythema, tenderness or bleeding.     Cervix: No cervical motion tenderness or friability.     Uterus: Not deviated and not enlarged.      Adnexa:        Right: No mass, tenderness or fullness.         Left: No mass, tenderness or fullness.       Rectum: Normal. No external hemorrhoid.  Lymphadenopathy:     Lower Body: No right inguinal adenopathy. No left inguinal adenopathy.  Skin:    General: Skin is warm and dry.  Neurological:     Mental Status: She is alert and oriented to person, place, and time.  Psychiatric:        Behavior: Behavior normal.        Thought Content: Thought content normal.        Judgment: Judgment normal.          Patient has been counseled extensively about nutrition and exercise as well as the importance of adherence with medications and regular follow-up. The patient was given clear instructions to go to ER or return to medical center if symptoms don't improve, worsen or new problems develop. The patient verbalized understanding.   Follow-up: Return in about 3 months (around 01/07/2021).   Claiborne Rigg, FNP-BC [facility] [place], [place] [phone]   10/08/2020, 1:02 PM

## 2020-10-09 ENCOUNTER — Other Ambulatory Visit: Payer: Self-pay | Admitting: Nurse Practitioner

## 2020-10-09 LAB — CERVICOVAGINAL ANCILLARY ONLY
Bacterial Vaginitis (gardnerella): POSITIVE — AB
Candida Glabrata: NEGATIVE
Candida Vaginitis: NEGATIVE
Chlamydia: NEGATIVE
Comment: NEGATIVE
Comment: NEGATIVE
Comment: NEGATIVE
Comment: NEGATIVE
Comment: NEGATIVE
Comment: NORMAL
Neisseria Gonorrhea: NEGATIVE
Trichomonas: NEGATIVE

## 2020-10-09 LAB — CYTOLOGY - PAP
Comment: NEGATIVE
Diagnosis: NEGATIVE
High risk HPV: NEGATIVE

## 2020-10-09 LAB — HIV ANTIBODY (ROUTINE TESTING W REFLEX): HIV Screen 4th Generation wRfx: NONREACTIVE

## 2020-10-09 MED ORDER — METRONIDAZOLE 500 MG PO TABS: 500.0000 mg | ORAL_TABLET | Freq: Two times a day (BID) | ORAL | 0 refills | Status: AC

## 2020-10-14 ENCOUNTER — Telehealth: Payer: Self-pay | Admitting: Nurse Practitioner

## 2020-10-14 NOTE — Telephone Encounter (Signed)
Copied from CRM [id number]. Topic: General - Call Back - No Documentation >> Oct 10, 2020  1:37 PM Randol Kern wrote: Reason for CRM: Pt returned call to office to discuss her lab results from her PAP smear. No authorization for [facility] to disclose.  Royce Macadamia, RN  10/10/2020 10:58 AM EDT  Called pt unable to reach, left VM to call back.  -pt is now awaiting return call from office. Tried calling front desk >> Oct 14, 2020  2:20 PM Pawlus, Maxine Glenn A wrote: Pt called back today stating she still has not received her lab results, no notes if [facility] NT can disclose. Please call pt back.

## 2020-10-14 NOTE — Telephone Encounter (Signed)
Return pt call and went over lab results pt is aware and doesn't have any questions or concerns  

## 2020-12-06 ENCOUNTER — Encounter (HOSPITAL_COMMUNITY): Payer: Self-pay

## 2020-12-06 ENCOUNTER — Emergency Department (HOSPITAL_COMMUNITY)
Admission: EM | Admit: 2020-12-06 | Discharge: 2020-12-06 | Disposition: A | Discharge status: Home / Self Care | Payer: Medicaid Other | Attending: Emergency Medicine | Admitting: Emergency Medicine

## 2020-12-06 DIAGNOSIS — Z79899 Other long term (current) drug therapy: Secondary | ICD-10-CM | POA: Insufficient documentation

## 2020-12-06 DIAGNOSIS — I10 Essential (primary) hypertension: Secondary | ICD-10-CM | POA: Diagnosis not present

## 2020-12-06 DIAGNOSIS — E876 Hypokalemia: Secondary | ICD-10-CM | POA: Insufficient documentation

## 2020-12-06 DIAGNOSIS — R2243 Localized swelling, mass and lump, lower limb, bilateral: Secondary | ICD-10-CM | POA: Diagnosis present

## 2020-12-06 DIAGNOSIS — R252 Cramp and spasm: Secondary | ICD-10-CM

## 2020-12-06 LAB — MAGNESIUM: Magnesium: 1.9 mg/dL (ref 1.7–2.4)

## 2020-12-06 LAB — CBC WITH DIFFERENTIAL/PLATELET
Abs Immature Granulocytes: 0.03 10*3/uL (ref 0.00–0.07)
Basophils Absolute: 0.1 10*3/uL (ref 0.0–0.1)
Basophils Relative: 1 %
Eosinophils Absolute: 0.1 10*3/uL (ref 0.0–0.5)
Eosinophils Relative: 1 %
HCT: 40.5 % (ref 36.0–46.0)
Hemoglobin: 14 g/dL (ref 12.0–15.0)
Immature Granulocytes: 0 %
Lymphocytes Relative: 27 %
Lymphs Abs: 2.4 10*3/uL (ref 0.7–4.0)
MCH: 31.8 pg (ref 26.0–34.0)
MCHC: 34.6 g/dL (ref 30.0–36.0)
MCV: 92 fL (ref 80.0–100.0)
Monocytes Absolute: 0.8 10*3/uL (ref 0.1–1.0)
Monocytes Relative: 9 %
Neutro Abs: 5.6 10*3/uL (ref 1.7–7.7)
Neutrophils Relative %: 62 %
Platelets: 370 10*3/uL (ref 150–400)
RBC: 4.4 MIL/uL (ref 3.87–5.11)
RDW: 13.3 % (ref 11.5–15.5)
WBC: 9 10*3/uL (ref 4.0–10.5)
nRBC: 0 % (ref 0.0–0.2)

## 2020-12-06 LAB — COMPREHENSIVE METABOLIC PANEL
ALT: 16 U/L (ref 0–44)
AST: 16 U/L (ref 15–41)
Albumin: 3.5 g/dL (ref 3.5–5.0)
Alkaline Phosphatase: 61 U/L (ref 38–126)
Anion gap: 10 (ref 5–15)
BUN: 10 mg/dL (ref 6–20)
CO2: 27 mmol/L (ref 22–32)
Calcium: 9.3 mg/dL (ref 8.9–10.3)
Chloride: 100 mmol/L (ref 98–111)
Creatinine, Ser: 0.87 mg/dL (ref 0.44–1.00)
GFR, Estimated: >60 mL/min (ref >60)
Glucose, Bld: 99 mg/dL (ref 70–99)
Potassium: 3 mmol/L — ABNORMAL LOW (ref 3.5–5.1)
Sodium: 137 mmol/L (ref 135–145)
Total Bilirubin: 2.3 mg/dL — ABNORMAL HIGH (ref 0.3–1.2)
Total Protein: 7.2 g/dL (ref 6.5–8.1)

## 2020-12-06 MED ORDER — POTASSIUM CHLORIDE 20 MEQ PO PACK
40.0000 meq | PACK | Freq: Once | ORAL | Status: AC
  Administered 2020-12-06: 40 meq via ORAL
  Filled 2020-12-06: qty 2

## 2020-12-06 MED ORDER — POTASSIUM CHLORIDE CRYS ER 20 MEQ PO TBCR: 20.0000 meq | EXTENDED_RELEASE_TABLET | Freq: Two times a day (BID) | ORAL | 0 refills | Status: DC

## 2020-12-06 MED ORDER — MAGNESIUM OXIDE -MG SUPPLEMENT 400 (240 MG) MG PO TABS
400.0000 mg | ORAL_TABLET | Freq: Once | ORAL | Status: AC
  Administered 2020-12-06: 400 mg via ORAL
  Filled 2020-12-06: qty 1

## 2020-12-06 NOTE — ED Provider Notes (Signed)
[facility] EMERGENCY DEPARTMENT Provider Note   CSN: [id number] Arrival date & time: 12/06/20  1848     History Chief Complaint  Patient presents with   Leg Cramping    Jacqueline Juarez is a 40 y.o. female.  The history is provided by the patient and medical records.  Leg Pain Location:  Leg Time since incident:  3 days Injury: no   Leg location:  L lower leg and R lower leg Pain details:    Quality:  Cramping   Radiates to:  Does not radiate   Severity:  Mild   Onset quality:  Gradual   Duration:  3 days   Timing:  Intermittent   Progression:  Unchanged Chronicity:  Recurrent Dislocation: no   Foreign body present:  No foreign bodies Tetanus status:  Unknown Prior injury to area:  No Relieved by:  None tried Worsened by:  Exercise Ineffective treatments:  None tried Associated symptoms: no back pain, no decreased ROM, no fatigue, no fever, no itching, no muscle weakness, no neck pain, no numbness, no stiffness, no swelling and no tingling   Risk factors: obesity   Risk factors: no concern for non-accidental trauma, no frequent fractures, no known bone disorder and no recent illness    No injury. No significant pain, just describes cramping. No fever, chills. No rashes or wounds.      Past Medical History:  Diagnosis Date   Anxiety    At age 47   Arthritis Dx 2014   Chronic back pain    High cholesterol Dx 2014   Hypertension DX 2014   Lumbar radiculopathy Dx 2015   Panic    At age 10   Paresthesias    left hand, bilat feet   Peripheral neuropathy    left hand    Patient Active Problem List   Diagnosis Date Noted   Bilateral low back pain without sciatica 11/02/2017   Lumbar radiculopathy 03/23/2017   Restless leg 11/24/2016   Right calf pain 09/25/2015   Constipation 07/10/2015   HTN (hypertension) 09/30/2014   Tinea pedis 09/30/2014   Onychomycosis of toenail 09/30/2014   Intertrigo 09/30/2014   Abnormality of gait  07/02/2014   Low back pain 06/06/2014   Paresthesia 06/06/2014    Past Surgical History:  Procedure Laterality Date   CESAREAN SECTION     NO PAST SURGERIES       OB History   No obstetric history on file.     Family History  Problem Relation Age of Onset   Arthritis Mother    Hypertension Mother    Arthritis Father     Social History   Tobacco Use   Smoking status: Never   Smokeless tobacco: Never  Vaping Use   Vaping Use: Never used  Substance Use Topics   Alcohol use: Not Currently   Drug use: No    Home Medications Prior to Admission medications   Medication Sig Start Date End Date Taking? Authorizing Provider  atorvastatin (LIPITOR) 20 MG tablet Take 1 tablet (20 mg total) by mouth daily. 08/16/20   Claiborne Rigg, NP  chlorthalidone (HYGROTON) 25 MG tablet TAKE 1/2 TABLET BY MOUTH EVERY DAY 09/05/20   Claiborne Rigg, NP  DULoxetine (CYMBALTA) 30 MG capsule Take 1 capsule (30 mg total) by mouth daily. 08/13/20   Claiborne Rigg, NP  gabapentin (NEURONTIN) 300 MG capsule Take 1 capsule every morning by mouth. Take 2 capsules by mouth at night 08/13/20  Claiborne Rigg, NP  hydrOXYzine (ATARAX/VISTARIL) 25 MG tablet TAKE 1 TABLET BY MOUTH EVERY 6 HOURS. 09/02/20   Hoy Register, MD  potassium chloride SA (KLOR-CON) 20 MEQ tablet Take 1 tablet (20 mEq total) by mouth 2 (two) times daily for 10 days. 12/06/20 12/16/20  Ardeen Fillers, DO  simethicone (GAS-X) 80 MG chewable tablet Chew 1 tablet (80 mg total) by mouth every 6 (six) hours as needed for flatulence. 10/08/20   Claiborne Rigg, NP    Allergies    Penicillins  Review of Systems   Review of Systems  Constitutional:  Negative for chills, fatigue and fever.  HENT:  Negative for ear pain and sore throat.   Eyes:  Negative for pain and visual disturbance.  Respiratory:  Negative for cough and shortness of breath.   Cardiovascular:  Negative for chest pain, palpitations and leg swelling (intermittent,  none today or recently).  Gastrointestinal:  Negative for abdominal pain and vomiting.  Genitourinary:  Negative for dysuria and hematuria.  Musculoskeletal:  Negative for arthralgias, back pain, neck pain and stiffness.  Skin:  Negative for color change, itching and rash.  Neurological:  Negative for seizures and syncope.  All other systems reviewed and are negative.  Physical Exam Updated Vital Signs BP 128/85   Pulse 71   Temp 99 F (37.2 C) (Oral)   Resp (!) 24   Ht 5\' 4"  (1.626 m)   Wt 99.8 kg   SpO2 100%   BMI 37.76 kg/m   Physical Exam Vitals and nursing note reviewed.  Constitutional:      General: She is not in acute distress.    Appearance: She is well-developed. She is obese. She is not ill-appearing or toxic-appearing.  HENT:     Head: Normocephalic and atraumatic.  Eyes:     Conjunctiva/sclera: Conjunctivae normal.  Cardiovascular:     Rate and Rhythm: Normal rate and regular rhythm.     Pulses: Normal pulses.          Radial pulses are 2+ on the right side and 2+ on the left side.       Dorsalis pedis pulses are 2+ on the right side and 2+ on the left side.     Heart sounds: Normal heart sounds. No murmur heard. Pulmonary:     Effort: Pulmonary effort is normal. No respiratory distress.     Breath sounds: Normal breath sounds.  Abdominal:     General: Abdomen is flat.     Palpations: Abdomen is soft.     Tenderness: There is no abdominal tenderness. Negative signs include Murphy's sign.     Comments: Benign abdominal exam  Musculoskeletal:     Cervical back: Neck supple.     Right lower leg: No edema.     Left lower leg: No edema.  Skin:    General: Skin is warm and dry.     Comments:  No rashes or wounds or other skin changes on the b/l lower extremities  Neurological:     General: No focal deficit present.     Mental Status: She is alert.     GCS: GCS eye subscore is 4. GCS verbal subscore is 5. GCS motor subscore is 6.     Sensory: Sensation is  intact.     Motor: Motor function is intact.    ED Results / Procedures / Treatments   Labs (all labs ordered are listed, but only abnormal results are displayed) Labs Reviewed  COMPREHENSIVE METABOLIC  PANEL - Abnormal; Notable for the following components:      Result Value   Potassium 3.0 (*)    Total Bilirubin 2.3 (*)    All other components within normal limits  CBC WITH DIFFERENTIAL/PLATELET  MAGNESIUM    EKG EKG Interpretation  Date/Time:  Saturday December 06 2020 22:09:47 EDT Ventricular Rate:  72 PR Interval:  136 QRS Duration: 79 QT Interval:  355 QTC Calculation: 389 R Axis:   60 Text Interpretation: Sinus rhythm RAE, consider biatrial enlargement No significant change from prior Jan 2012 ecg Confirmed by Alvester Chourifan, Matthew [phone] on 12/06/2020 10:18:01 PM  Radiology No results found.  Procedures Procedures   Medications Ordered in ED Medications  potassium chloride (KLOR-CON) packet 40 mEq (has no administration in time range)  magnesium oxide (MAG-OX) tablet 400 mg (has no administration in time range)    ED Course  I have reviewed the triage vital signs and the nursing notes.  Pertinent labs & imaging results that were available during my care of the patient were reviewed by me and considered in my medical decision making (see chart for details).  Clinical Course as of 12/06/20 2246  Sat Dec 06, 2020  2229 This is a 40 year old female with a history of obesity presenting to ED with bilateral leg cramping.  This been going on for many many months.  She describes intermittent cramping in her calves and upper legs.  No history of clotting.  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  Clinical exam her lower extremities are symmetrical.  There is no evidence of erythema, warmth, or infection on exam.  There is a very low suspicion for DVT given that this been ongoing for months and is bilateral and intermittent.   Her labs do show some hypokalemia with a potassium of 3.0.  Magnesium is 1.9.  We will continue to replete these electrolytes and I advised that she follow-up with her PCP. [MT]    Clinical Course User Index [MT] Trifan, Kermit BaloMatthew J, MD   MDM Rules/Calculators/A&P                          This is a 40 year old female with history as above who presented to the emergency department with intermittent bilateral leg cramping for the past 4 days. Emergency department she was normotensive, not tachycardic and afebrile. There is no evidence of wound acute or chronic, rashes on exam. She has no reported injury or trauma to the lower extremities. No deformities or swelling to the joints of the lower extremities. She reports intermittent, waxing and waning lower extremity edema however none currently or within the last 3-4 days. Presentation will be very atypical for bilateral DVT.  No history of prior VTE.  Do not feel as though further VTE work-up is indicated at this time.  Most likely, her presentation and symptoms are related to hypokalemia which she has a history of and supposed be taking supplementation for.  Potassium and magnesium was repleted in the emergency department.  I wrote her for a prescription for potassium to take twice daily.  She will schedule an appointment with her primary care doctor for repeat labs in approximately 1 week.  Final Clinical Impression(s) / ED Diagnoses Final diagnoses:  Hypokalemia  Leg cramping    Rx / DC Orders ED Discharge Orders          Ordered    potassium chloride SA (  KLOR-CON) 20 MEQ tablet  2 times daily        12/06/20 2245             Ardeen Fillers, DO 12/06/20 2246    Terald Sleeper, MD 12/07/20 1023

## 2020-12-06 NOTE — ED Provider Notes (Signed)
Emergency Medicine Provider Triage Evaluation Note  Jacqueline Juarez , a 40 y.o. female  was evaluated in triage.  Pt complains of presents with concern for bilateral lower extremity edema with right calf cramping x3 days.  Patient is extremely poor historian, unable to identify if swelling is new or not.  States she has history of low back pain because numbness and tingling down her legs, she denies any numbness or tingling in her legs at this time but has severe calf pain.  History of hypokalemia..  Review of Systems  Positive: BLE edema, calf pain Negative: Chest pain, shortness of breath palpitations.  Physical Exam  BP 138/88 (BP Location: Left Wrist)   Pulse 73   Temp 99 F (37.2 C) (Oral)   Resp 18   Ht 5\' 4"  (1.626 m)   Wt 99.8 kg   SpO2 99%   BMI 37.76 kg/m  Gen:   Awake, no distress   Resp:  Normal effort  MSK:   Moves extremities without difficulty  Other:  BLE edema without pitting.  Right calf tenderness palpatio left calf tenderness to palpation.  Medical Decision Making  Medically screening exam initiated at 7:24 PM.  Appropriate orders placed.  Jacqueline Juarez was informed that the remainder of the evaluation will be completed by another provider, this initial triage assessment does not replace that evaluation, and the importance of remaining in the ED until their evaluation is complete.  This chart was dictated using voice recognition software, Dragon. Despite the best efforts of this provider to proofread and correct errors, errors may still occur which can change documentation meaning.    Mayford Knife 12/06/20 1935    12/08/20, MD 12/07/20 1251

## 2020-12-06 NOTE — ED Triage Notes (Signed)
Pt arrives POV for eval of blt leg cramping x 2-3 days. Pt reports hx of same, but states worse today. Pt also w/ blt leg swelling which she reports is her baseline. Denies any other complaints, endorses worse w/ walkin g

## 2020-12-08 ENCOUNTER — Telehealth: Payer: Self-pay | Admitting: Nurse Practitioner

## 2020-12-08 NOTE — Telephone Encounter (Signed)
Pt was called could not leave VM to follow up on most recent ED VIsit

## 2021-02-04 ENCOUNTER — Telehealth: Payer: Self-pay | Admitting: Nurse Practitioner

## 2021-02-04 ENCOUNTER — Ambulatory Visit: Payer: Medicaid Other | Admitting: Nurse Practitioner

## 2021-02-04 ENCOUNTER — Other Ambulatory Visit: Payer: Self-pay | Admitting: Nurse Practitioner

## 2021-02-04 ENCOUNTER — Encounter: Payer: Self-pay | Admitting: Nurse Practitioner

## 2021-02-04 ENCOUNTER — Other Ambulatory Visit: Payer: Self-pay

## 2021-02-04 ENCOUNTER — Ambulatory Visit: Payer: Medicaid Other | Attending: Nurse Practitioner | Admitting: Nurse Practitioner

## 2021-02-04 DIAGNOSIS — E876 Hypokalemia: Secondary | ICD-10-CM

## 2021-02-04 DIAGNOSIS — I10 Essential (primary) hypertension: Secondary | ICD-10-CM | POA: Diagnosis not present

## 2021-02-04 DIAGNOSIS — G6289 Other specified polyneuropathies: Secondary | ICD-10-CM

## 2021-02-04 MED ORDER — POTASSIUM CHLORIDE CRYS ER 20 MEQ PO TBCR
20.0000 meq | EXTENDED_RELEASE_TABLET | Freq: Two times a day (BID) | ORAL | 0 refills | Status: DC
Start: 2021-02-04 — End: 2021-04-15

## 2021-02-04 NOTE — Telephone Encounter (Signed)
Unable to reach or LVM

## 2021-02-04 NOTE — Progress Notes (Signed)
Virtual Visit via Telephone Note Due to national recommendations of social distancing due to COVID 19, telehealth visit is felt to be most appropriate for this patient at this time.  I discussed the limitations, risks, security and privacy concerns of performing an evaluation and management service by telephone and the availability of in person appointments. I also discussed with the patient that there may be a patient responsible charge related to this service. The patient expressed understanding and agreed to proceed.    I connected with Jacqueline Juarez on 02/04/21  at   1:30 PM EDT  EDT by telephone and verified that I am speaking with the correct person using two identifiers.  Location of Patient: Private Residence   [facility] Provider: [facility]    Persons participating in Telemedicine visit: Bertram Denver FNP-BC Carina D Greer    History of Present Illness: Telemedicine visit for: Bilateral leg cramps She has a past medical history of Anxiety, Arthritis (Dx 2014), Chronic back pain, High cholesterol (Dx 2014), Hypertension (DX 2014), Lumbar radiculopathy (Dx 2015), Panic, Paresthesias, and Peripheral neuropathy.   Endorses bilateral leg pain. Pain is described as sharp, cramping and burning. She has a history of peripheral neuropathy but never picked up Duloxetine which was prescribed in February and per pharmacy she has not picked up gabapentin since 09-2019. She also has a history of hypokalemia. We may need to dc chlorthalidone if continues. She was supposed to have her potassium checked today however she left the office without blood work.   There appears to be a cognitive delay although I do not see any history of this in her chart.   HTN Well controlled with chlorthalidone. May need to switch to losartan if continues with hypokalemia. Denies chest pain, shortness of breath, palpitations, lightheadedness, dizziness, headaches or BLE  edema.   BP Readings from Last 3 Encounters:  12/06/20 128/85  10/08/20 139/87  09/15/20 118/82       Past Medical History:  Diagnosis Date   Anxiety    At age 40   Arthritis Dx 2014   Chronic back pain    High cholesterol Dx 2014   Hypertension DX 2014   Lumbar radiculopathy Dx 2015   Panic    At age 40   Paresthesias    left hand, bilat feet   Peripheral neuropathy    left hand    Past Surgical History:  Procedure Laterality Date   CESAREAN SECTION     NO PAST SURGERIES      Family History  Problem Relation Age of Onset   Arthritis Mother    Hypertension Mother    Arthritis Father     Social History   Socioeconomic History   Marital status: Single    Spouse name: Not on file   Number of children: 2   Years of education: 9th   Highest education level: Not on file  Occupational History    Comment: disabled  Tobacco Use   Smoking status: Never   Smokeless tobacco: Never  Vaping Use   Vaping Use: Never used  Substance and Sexual Activity   Alcohol use: Not Currently   Drug use: No   Sexual activity: Not Currently  Other Topics Concern   Not on file  Social History Narrative   Patient is single and lives at home with her children.    Patient is disabled.   Education 9 th grade.   Right handed.   Caffeine sweet tea two  bottles daily.   Social Determinants of Health   Financial Resource Strain: Not on file  Food Insecurity: Not on file  Transportation Needs: Not on file  Physical Activity: Not on file  Stress: Not on file  Social Connections: Not on file     Observations/Objective: Awake, alert and oriented x 3   Review of Systems  Constitutional:  Negative for fever, malaise/fatigue and weight loss.  HENT: Negative.  Negative for nosebleeds.   Eyes: Negative.  Negative for blurred vision, double vision and photophobia.  Respiratory: Negative.  Negative for cough and shortness of breath.   Cardiovascular: Negative.  Negative for chest  pain, palpitations and leg swelling.  Gastrointestinal: Negative.  Negative for heartburn, nausea and vomiting.  Musculoskeletal:  Negative for myalgias.       SEE HPI  Neurological:  Positive for sensory change. Negative for dizziness, focal weakness, seizures and headaches.  Psychiatric/Behavioral: Negative.  Negative for suicidal ideas.    Assessment and Plan: Diagnoses and all orders for this visit:  Essential hypertension  Other polyneuropathy    Follow Up Instructions Return in about 1 week (around 02/11/2021) for Hypokalemia. Possible BP med change.     I discussed the assessment and treatment plan with the patient. The patient was provided an opportunity to ask questions and all were answered. The patient agreed with the plan and demonstrated an understanding of the instructions.   The patient was advised to call back or seek an in-person evaluation if the symptoms worsen or if the condition fails to improve as anticipated.  I provided 14 minutes of non-face-to-face time during this encounter including median intraservice time, reviewing previous notes, labs, imaging, medications and explaining diagnosis and management.  Claiborne Rigg, FNP-BC

## 2021-02-04 NOTE — Progress Notes (Unsigned)
Virtual Visit via Telephone Note Due to national recommendations of social distancing due to COVID 19, telehealth visit is felt to be most appropriate for this patient at this time.  I discussed the limitations, risks, security and privacy concerns of performing an evaluation and management service by telephone and the availability of in person appointments. I also discussed with the patient that there may be a patient responsible charge related to this service. The patient expressed understanding and agreed to proceed.    I connected with Jacqueline Juarez on 02/04/21  at    EDT by telephone and verified that I am speaking with the correct person using two identifiers.  Location of Patient: Private Residence   [facility] of Provider: [facility]    Persons participating in Telemedicine visit: Bertram Denver FNP-BC Kevionna D Ribaudo    History of Present Illness: Telemedicine visit for: ***  All day Back and legs [place]as[place]   Past Medical History:  Diagnosis Date   Anxiety    At age 89   Arthritis Dx 2014   Chronic back pain    High cholesterol Dx 2014   Hypertension DX 2014   Lumbar radiculopathy Dx 2015   Panic    At age 27   Paresthesias    left hand, bilat feet   Peripheral neuropathy    left hand    Past Surgical History:  Procedure Laterality Date   CESAREAN SECTION     NO PAST SURGERIES      Family History  Problem Relation Age of Onset   Arthritis Mother    Hypertension Mother    Arthritis Father     Social History   Socioeconomic History   Marital status: Single    Spouse name: Not on file   Number of children: 2   Years of education: 9th   Highest education level: Not on file  Occupational History    Comment: disabled  Tobacco Use   Smoking status: Never   Smokeless tobacco: Never  Vaping Use   Vaping Use: Never used  Substance and Sexual Activity   Alcohol use: Not Currently   Drug use: No   Sexual activity:  Not Currently  Other Topics Concern   Not on file  Social History Narrative   Patient is single and lives at home with her children.    Patient is disabled.   Education 9 th grade.   Right handed.   Caffeine sweet tea two bottles daily.   Social Determinants of Health   Financial Resource Strain: Not on file  Food Insecurity: Not on file  Transportation Needs: Not on file  Physical Activity: Not on file  Stress: Not on file  Social Connections: Not on file     Observations/Objective: Awake, alert and oriented x 3   ROS  Assessment and Plan: There are no diagnoses linked to this encounter.   Follow Up Instructions No follow-ups on file.     I discussed the assessment and treatment plan with the patient. The patient was provided an opportunity to ask questions and all were answered. The patient agreed with the plan and demonstrated an understanding of the instructions.   The patient was advised to call back or seek an in-person evaluation if the symptoms worsen or if the condition fails to improve as anticipated.  I provided *** minutes of non-face-to-face time during this encounter including median intraservice time, reviewing previous notes, labs, imaging, medications and explaining diagnosis and management.  Gildardo Pounds, FNP-BC

## 2021-02-05 ENCOUNTER — Ambulatory Visit: Payer: Medicaid Other | Admitting: Physician Assistant

## 2021-02-05 LAB — BASIC METABOLIC PANEL
BUN/Creatinine Ratio: 13 (ref 9–23)
BUN: 12 mg/dL (ref 6–24)
CO2: 28 mmol/L (ref 20–29)
Calcium: 9.5 mg/dL (ref 8.7–10.2)
Chloride: 99 mmol/L (ref 96–106)
Creatinine, Ser: 0.91 mg/dL (ref 0.57–1.00)
Glucose: 80 mg/dL (ref 65–99)
Potassium: 3.3 mmol/L — ABNORMAL LOW (ref 3.5–5.2)
Sodium: 140 mmol/L (ref 134–144)
eGFR: 81 mL/min/{1.73_m2} (ref >59)

## 2021-02-05 NOTE — Telephone Encounter (Signed)
   Notes to clinic:  REQUEST FOR 90 DAYS PRESCRIPTION.   Requested Prescriptions  Pending Prescriptions Disp Refills   KLOR-CON M20 20 MEQ tablet [Pharmacy Med Name: KLOR-CON M20 TABLET] 180 tablet 1    Sig: TAKE 1 TABLET BY MOUTH 2 TIMES DAILY FOR 15 DAYS     Endocrinology:  Minerals - Potassium Supplementation Failed - 02/04/2021  7:32 PM      Failed - K in normal range and within 360 days    Potassium  Date Value Ref Range Status  02/04/2021 3.3 (L) 3.5 - 5.2 mmol/L Final          Passed - Cr in normal range and within 360 days    Creat  Date Value Ref Range Status  08/16/2016 0.62 0.50 - 1.10 mg/dL Final   Creatinine, Ser  Date Value Ref Range Status  02/04/2021 0.91 0.57 - 1.00 mg/dL Final          Passed - Valid encounter within last 12 months    Recent Outpatient Visits           Yesterday Essential hypertension   [facility] [place], Shea Stakes, NP   4 months ago Encounter for Papanicolaou smear for cervical cancer screening   [facility] [place], Shea Stakes, NP   5 months ago Encounter to establish care   [facility] [place], Shea Stakes, NP   3 years ago Paresthesia   [facility] Hoy Register, MD   3 years ago Need for influenza vaccination   [facility] Hoy Register, MD       Future Appointments             In 6 days Claiborne Rigg, NP [facility]

## 2021-02-11 ENCOUNTER — Ambulatory Visit: Payer: Medicaid Other | Admitting: Nurse Practitioner

## 2021-02-13 ENCOUNTER — Other Ambulatory Visit: Payer: Self-pay | Admitting: Nurse Practitioner

## 2021-02-13 DIAGNOSIS — E876 Hypokalemia: Secondary | ICD-10-CM

## 2021-02-14 NOTE — Telephone Encounter (Signed)
Called pharmacy and informed this med was dc'd pharmacist inactivated

## 2021-03-11 ENCOUNTER — Telehealth: Payer: Self-pay | Admitting: Nurse Practitioner

## 2021-03-11 NOTE — Telephone Encounter (Signed)
Copied from CRM [id number]. Topic: General - Other >> Mar 09, 2021  3:20 PM Gwenlyn Fudge wrote: Reason for CRM: Pt called stating that she is needing to have her lab results. She states that she is also having cramping in her legs x2-3. Please advise.

## 2021-03-16 NOTE — Telephone Encounter (Signed)
Called pt and she informed me she already has an appointment for 10/12 for cramping in her legs.

## 2021-04-01 ENCOUNTER — Ambulatory Visit: Payer: Medicaid Other | Admitting: Physician Assistant

## 2021-04-13 ENCOUNTER — Other Ambulatory Visit: Payer: Self-pay | Admitting: Nurse Practitioner

## 2021-04-13 DIAGNOSIS — I10 Essential (primary) hypertension: Secondary | ICD-10-CM

## 2021-04-13 NOTE — Telephone Encounter (Signed)
Requested Prescriptions  Pending Prescriptions Disp Refills  . chlorthalidone (HYGROTON) 25 MG tablet [Pharmacy Med Name: CHLORTHALIDONE 25 MG TABLET] 45 tablet 0    Sig: TAKE 1/2 TABLET BY MOUTH EVERY DAY     Cardiovascular: Diuretics - Thiazide Failed - 04/13/2021 12:01 PM      Failed - K in normal range and within 360 days    Potassium  Date Value Ref Range Status  02/04/2021 3.3 (L) 3.5 - 5.2 mmol/L Final         Passed - Ca in normal range and within 360 days    Calcium  Date Value Ref Range Status  02/04/2021 9.5 8.7 - 10.2 mg/dL Final   Calcium, Ion  Date Value Ref Range Status  03/20/2012 1.13 1.12 - 1.23 mmol/L Final         Passed - Cr in normal range and within 360 days    Creat  Date Value Ref Range Status  08/16/2016 0.62 0.50 - 1.10 mg/dL Final   Creatinine, Ser  Date Value Ref Range Status  02/04/2021 0.91 0.57 - 1.00 mg/dL Final         Passed - Na in normal range and within 360 days    Sodium  Date Value Ref Range Status  02/04/2021 140 134 - 144 mmol/L Final         Passed - Last BP in normal range    BP Readings from Last 1 Encounters:  12/06/20 128/85         Passed - Valid encounter within last 6 months    Recent Outpatient Visits          2 months ago Essential hypertension   [facility] [place], Shea Stakes, NP   6 months ago Encounter for Papanicolaou smear for cervical cancer screening   [facility] [place], Shea Stakes, NP   8 months ago Encounter to establish care   [facility] [place], Shea Stakes, NP   3 years ago Paresthesia   [facility] Hoy Register, MD   4 years ago Need for influenza vaccination   [facility] Hoy Register, MD      Future Appointments            In 2 days Sharon Seller, Marzella Schlein, PA-C [facility]

## 2021-04-15 ENCOUNTER — Other Ambulatory Visit: Payer: Self-pay

## 2021-04-15 ENCOUNTER — Ambulatory Visit: Payer: Medicaid Other | Attending: Physician Assistant | Admitting: Physician Assistant

## 2021-04-15 ENCOUNTER — Encounter: Payer: Self-pay | Admitting: Physician Assistant

## 2021-04-15 VITALS — BP 137/90 | HR 91 | Resp 16 | Wt 242.4 lb

## 2021-04-15 DIAGNOSIS — R21 Rash and other nonspecific skin eruption: Secondary | ICD-10-CM | POA: Diagnosis not present

## 2021-04-15 DIAGNOSIS — M5416 Radiculopathy, lumbar region: Secondary | ICD-10-CM

## 2021-04-15 DIAGNOSIS — E876 Hypokalemia: Secondary | ICD-10-CM

## 2021-04-15 DIAGNOSIS — F32A Depression, unspecified: Secondary | ICD-10-CM

## 2021-04-15 DIAGNOSIS — I10 Essential (primary) hypertension: Secondary | ICD-10-CM | POA: Diagnosis not present

## 2021-04-15 DIAGNOSIS — F419 Anxiety disorder, unspecified: Secondary | ICD-10-CM

## 2021-04-15 MED ORDER — GABAPENTIN 300 MG PO CAPS: ORAL_CAPSULE | ORAL | 3 refills | Status: AC

## 2021-04-15 MED ORDER — HYDROXYZINE HCL 25 MG PO TABS: 25.0000 mg | ORAL_TABLET | Freq: Four times a day (QID) | ORAL | 1 refills | Status: AC

## 2021-04-15 MED ORDER — FLUCONAZOLE 150 MG PO TABS: 150.0000 mg | ORAL_TABLET | Freq: Once | ORAL | 0 refills | Status: AC

## 2021-04-15 MED ORDER — DULOXETINE HCL 30 MG PO CPEP
30.0000 mg | ORAL_CAPSULE | Freq: Every day | ORAL | 3 refills | Status: AC
Start: 2021-04-15 — End: ?

## 2021-04-15 MED ORDER — CHLORTHALIDONE 25 MG PO TABS: 12.5000 mg | ORAL_TABLET | Freq: Every day | ORAL | 0 refills | Status: AC

## 2021-04-15 MED ORDER — CLOTRIMAZOLE-BETAMETHASONE 1-0.05 % EX CREA
1.0000 | TOPICAL_CREAM | Freq: Two times a day (BID) | CUTANEOUS | 1 refills | Status: AC
Start: 2021-04-15 — End: ?

## 2021-04-15 MED ORDER — POTASSIUM CHLORIDE CRYS ER 20 MEQ PO TBCR: 20.0000 meq | EXTENDED_RELEASE_TABLET | Freq: Every day | ORAL | 3 refills | Status: DC

## 2021-04-15 MED ORDER — TIZANIDINE HCL 4 MG PO CAPS: ORAL_CAPSULE | ORAL | 2 refills | Status: DC

## 2021-04-15 NOTE — Progress Notes (Signed)
Patient ID: Jacqueline Juarez, female   DOB: Jan 14, 1981, 40 y.o.   MRN: [id number]     Jacqueline Juarez, is a 40 y.o. female  [id number]:[id number]  [id number]:[id number]  DOB - 1980-12-01  Chief Complaint  Patient presents with   Back Pain   Leg Pain   Rash       Subjective:   Jacqueline Juarez is a 40 y.o. female here today for med RF and Patient has an itchy rash bt her legs for about 1 weeks.  Denies vaginal discharge.  No pelvis pain.  No headache, No chest pain, No abdominal pain - No Nausea, No new weakness tingling or numbness, No Cough - SOB.  No problems updated.  ALLERGIES: Allergies  Allergen Reactions   Penicillins Hives    Has patient had a PCN reaction causing immediate rash, facial/tongue/throat swelling, SOB or lightheadedness with hypotension: No Has patient had a PCN reaction causing severe rash involving mucus membranes or skin necrosis: No Has patient had a PCN reaction that required hospitalization No Has patient had a PCN reaction occurring within the last 10 years: No If all of the above answers are "NO", then may proceed with Cephalosporin use.    PAST MEDICAL HISTORY: Past Medical History:  Diagnosis Date   Anxiety    At age 49   Arthritis Dx 2014   Chronic back pain    High cholesterol Dx 2014   Hypertension DX 2014   Lumbar radiculopathy Dx 2015   Panic    At age 32   Paresthesias    left hand, bilat feet   Peripheral neuropathy    left hand    MEDICATIONS AT HOME: Prior to Admission medications   Medication Sig Start Date End Date Taking? Authorizing Provider  clotrimazole-betamethasone (LOTRISONE) cream Apply 1 application topically 2 (two) times daily. X 79month 04/15/21  Yes Noal Abshier M, PA-C  fluconazole (DIFLUCAN) 150 MG tablet Take 1 tablet (150 mg total) by mouth once for 1 dose. 04/15/21 04/15/21 Yes Lua Feng, Marzella Schlein, PA-C  atorvastatin (LIPITOR) 20 MG tablet Take 1 tablet (20 mg total) by mouth daily. 08/16/20   Claiborne Rigg, NP   chlorthalidone (HYGROTON) 25 MG tablet Take 0.5 tablets (12.5 mg total) by mouth daily. 04/15/21   Anders Simmonds, PA-C  DULoxetine (CYMBALTA) 30 MG capsule Take 1 capsule (30 mg total) by mouth daily. 04/15/21   Anders Simmonds, PA-C  gabapentin (NEURONTIN) 300 MG capsule Take 1 capsule every morning by mouth. Take 2 capsules by mouth at night 04/15/21   Georgian Co M, PA-C  hydrOXYzine (ATARAX/VISTARIL) 25 MG tablet Take 1 tablet (25 mg total) by mouth every 6 (six) hours. 04/15/21   Anders Simmonds, PA-C  potassium chloride SA (KLOR-CON) 20 MEQ tablet Take 1 tablet (20 mEq total) by mouth daily. 04/15/21   Anders Simmonds, PA-C  simethicone (GAS-X) 80 MG chewable tablet Chew 1 tablet (80 mg total) by mouth every 6 (six) hours as needed for flatulence. 10/08/20   Claiborne Rigg, NP  tiZANidine (ZANAFLEX) 4 MG capsule tizanidine 4 mg capsule  ONE CAPSULE BY MOUTH EVERY 8 TO 12 HOURS AS NEEDED FOR MUSCLE SPASMS 04/15/21   Tanvir Hipple, Marzella Schlein, PA-C    ROS: Neg HEENT Neg resp Neg cardiac Neg GI Neg GU Neg MS Neg neuro  Objective:   Vitals:   04/15/21 1036  BP: 137/90  Pulse: 91  Resp: 16  SpO2: 99%  Weight: 242 lb 6.4 oz (110 kg)  Exam General appearance : Awake, alert, not in any distress. Speech Clear. Not toxic looking;  poor historian.  Difficult to follow HEENT: Atraumatic and Normocephalic Neck: Supple, no JVD. No cervical lymphadenopathy.  Chest: Good air entry bilaterally, CTAB.  No rales/rhonchi/wheezing CVS: S1 S2 regular, no murmurs.  Extremities: B/L Lower Ext shows no edema, both legs are warm to touch Neurology: Awake alert, and oriented X 3, CN II-XII intact, Non focal Skin: bt thighs she has what appears to be a fungal rash.  No broken skin.    Data Review Lab Results  Component Value Date   HGBA1C 4.6 (L) 08/13/2020   HGBA1C 5.1 08/29/2018   HGBA1C 4.8 08/12/2017    Assessment & Plan   1. Lumbar radiculopathy - tiZANidine (ZANAFLEX) 4 MG  capsule; tizanidine 4 mg capsule  ONE CAPSULE BY MOUTH EVERY 8 TO 12 HOURS AS NEEDED FOR MUSCLE SPASMS  Dispense: 60 capsule; Refill: 2 - gabapentin (NEURONTIN) 300 MG capsule; Take 1 capsule every morning by mouth. Take 2 capsules by mouth at night  Dispense: 90 capsule; Refill: 3 - DULoxetine (CYMBALTA) 30 MG capsule; Take 1 capsule (30 mg total) by mouth daily.  Dispense: 90 capsule; Refill: 3  2. Essential hypertension Suboptima today but did not take meds - chlorthalidone (HYGROTON) 25 MG tablet; Take 0.5 tablets (12.5 mg total) by mouth daily.  Dispense: 45 tablet; Refill: 0  3. Hypokalemia - potassium chloride SA (KLOR-CON) 20 MEQ tablet; Take 1 tablet (20 mEq total) by mouth daily.  Dispense: 30 tablet; Refill: 3 - Basic metabolic panel  4. Anxiety and depression stable - DULoxetine (CYMBALTA) 30 MG capsule; Take 1 capsule (30 mg total) by mouth daily.  Dispense: 90 capsule; Refill: 3 - hydrOXYzine (ATARAX/VISTARIL) 25 MG tablet; Take 1 tablet (25 mg total) by mouth every 6 (six) hours.  Dispense: 360 tablet; Refill: 1  5. Rash of groin - clotrimazole-betamethasone (LOTRISONE) cream; Apply 1 application topically 2 (two) times daily. X 84month  Dispense: 30 g; Refill: 1 - fluconazole (DIFLUCAN) 150 MG tablet; Take 1 tablet (150 mg total) by mouth once for 1 dose.  Dispense: 1 tablet; Refill: 0    Patient have been counseled extensively about nutrition and exercise. Other issues discussed during this visit include: low cholesterol diet, weight control and daily exercise, foot care, annual eye examinations at Ophthalmology, importance of adherence with medications and regular follow-up. We also discussed long term complications of uncontrolled diabetes and hypertension.   Return in about 3 months (around 07/16/2021) for PCP/chronic conditions.  The patient was given clear instructions to go to ER or return to medical center if symptoms don't improve, worsen or new problems develop. The  patient verbalized understanding. The patient was told to call to get lab results if they haven't heard anything in the next week.      Georgian Co, PA-C [facility] and [facility] [place], [place] [phone]   04/15/2021, 10:54 AM

## 2021-04-16 LAB — BASIC METABOLIC PANEL WITH GFR
BUN/Creatinine Ratio: 8 — ABNORMAL LOW (ref 9–23)
BUN: 6 mg/dL (ref 6–24)
CO2: 19 mmol/L — ABNORMAL LOW (ref 20–29)
Calcium: 9.2 mg/dL (ref 8.7–10.2)
Chloride: 104 mmol/L (ref 96–106)
Creatinine, Ser: 0.71 mg/dL (ref 0.57–1.00)
Glucose: 94 mg/dL (ref 70–99)
Potassium: 3.9 mmol/L (ref 3.5–5.2)
Sodium: 139 mmol/L (ref 134–144)
eGFR: 109 mL/min/1.73 (ref >59)

## 2021-04-23 ENCOUNTER — Encounter: Payer: Self-pay | Admitting: *Deleted

## 2021-05-07 ENCOUNTER — Other Ambulatory Visit: Payer: Self-pay | Admitting: Pharmacist

## 2021-05-07 MED ORDER — TIZANIDINE HCL 4 MG PO TABS: ORAL_TABLET | ORAL | 2 refills | Status: AC

## 2021-05-29 ENCOUNTER — Telehealth: Payer: Self-pay | Admitting: Nurse Practitioner

## 2021-05-29 NOTE — Telephone Encounter (Signed)
Copied from CRM [id number]. Topic: General - Other >> May 28, 2021  2:36 PM Laural Benes, [place] C wrote: Reason for CRM: pt called in for her most recent lab results. Pt would also like to know if she has had a pap smear?   Please assist further.    CB: [phone]

## 2021-05-29 NOTE — Telephone Encounter (Signed)
Left message to return call to our office.  

## 2021-07-15 ENCOUNTER — Other Ambulatory Visit: Payer: Self-pay

## 2021-07-22 ENCOUNTER — Ambulatory Visit: Payer: Medicaid Other | Attending: Nurse Practitioner | Admitting: Nurse Practitioner

## 2021-07-22 ENCOUNTER — Other Ambulatory Visit: Payer: Self-pay

## 2021-07-22 ENCOUNTER — Encounter: Payer: Self-pay | Admitting: Nurse Practitioner

## 2021-07-22 VITALS — BP 119/86 | HR 82 | Ht 64.0 in | Wt 229.0 lb

## 2021-07-22 DIAGNOSIS — Z23 Encounter for immunization: Secondary | ICD-10-CM | POA: Diagnosis not present

## 2021-07-22 DIAGNOSIS — I10 Essential (primary) hypertension: Secondary | ICD-10-CM | POA: Diagnosis not present

## 2021-07-22 DIAGNOSIS — E876 Hypokalemia: Secondary | ICD-10-CM

## 2021-07-22 DIAGNOSIS — E785 Hyperlipidemia, unspecified: Secondary | ICD-10-CM | POA: Diagnosis not present

## 2021-07-22 MED ORDER — POTASSIUM CHLORIDE CRYS ER 20 MEQ PO TBCR: 20.0000 meq | EXTENDED_RELEASE_TABLET | Freq: Every day | ORAL | 3 refills | Status: AC

## 2021-07-22 NOTE — Progress Notes (Signed)
Assessment & Plan:  Jacqueline Juarez was seen today for hypertension.  Diagnoses and all orders for this visit:  Essential hypertension Continue chlorthalidone as prescribed  Remember to bring in your blood pressure log with you for your follow up appointment.  DASH/Mediterranean Diets are healthier choices for HTN.    Need for influenza vaccination -     Flu Vaccine QUAD 2mo+IM (Fluarix, Fluzone & Alfiuria Quad PF)  Hypokalemia -     potassium chloride SA (KLOR-CON M) 20 MEQ tablet; Take 1 tablet (20 mEq total) by mouth daily.  Dyslipidemia, goal LDL below 100 -     Lipid panel    Patient has been counseled on age-appropriate routine health concerns for screening and prevention. These are reviewed and up-to-date. Referrals have been placed accordingly. Immunizations are up-to-date or declined.    Subjective:   Chief Complaint  Patient presents with   Hypertension   HPI Jacqueline Juarez 41 y.o. female presents to office today for follow-up to hypertension.   She has a past medical history of Anxiety, Arthritis (Dx 2014), Chronic back pain, High cholesterol (Dx 2014), Hypertension (DX 2014), Lumbar radiculopathy (Dx 2015), Panic, Paresthesias, and Peripheral neuropathy.   HTN Blood pressure well controlled taking chlorthalidone 12.5 mg daily.  She does not monitor her blood pressure at home. She takes potassium chloride for hypokalemia.  BP Readings from Last 3 Encounters:  07/22/21 119/86  04/15/21 137/90  12/06/20 128/85     Review of Systems  Constitutional:  Negative for fever, malaise/fatigue and weight loss.  HENT: Negative.  Negative for nosebleeds.   Eyes: Negative.  Negative for blurred vision, double vision and photophobia.  Respiratory: Negative.  Negative for cough and shortness of breath.   Cardiovascular: Negative.  Negative for chest pain, palpitations and leg swelling.  Gastrointestinal: Negative.  Negative for heartburn, nausea and vomiting.   Musculoskeletal: Negative.  Negative for myalgias.  Neurological: Negative.  Negative for dizziness, focal weakness, seizures and headaches.  Psychiatric/Behavioral: Negative.  Negative for suicidal ideas.    Past Medical History:  Diagnosis Date   Anxiety    At age 1   Arthritis Dx 2014   Chronic back pain    High cholesterol Dx 2014   Hypertension DX 2014   Lumbar radiculopathy Dx 2015   Panic    At age 40   Paresthesias    left hand, bilat feet   Peripheral neuropathy    left hand    Past Surgical History:  Procedure Laterality Date   CESAREAN SECTION     NO PAST SURGERIES      Family History  Problem Relation Age of Onset   Arthritis Mother    Hypertension Mother    Arthritis Father     Social History Reviewed with no changes to be made today.   Outpatient Medications Prior to Visit  Medication Sig Dispense Refill   atorvastatin (LIPITOR) 20 MG tablet Take 1 tablet (20 mg total) by mouth daily. 90 tablet 3   chlorthalidone (HYGROTON) 25 MG tablet Take 0.5 tablets (12.5 mg total) by mouth daily. 45 tablet 0   clotrimazole-betamethasone (LOTRISONE) cream Apply 1 application topically 2 (two) times daily. X 48month 30 g 1   DULoxetine (CYMBALTA) 30 MG capsule Take 1 capsule (30 mg total) by mouth daily. 90 capsule 3   gabapentin (NEURONTIN) 300 MG capsule Take 1 capsule every morning by mouth. Take 2 capsules by mouth at night 90 capsule 3   hydrOXYzine (ATARAX/VISTARIL) 25 MG  tablet Take 1 tablet (25 mg total) by mouth every 6 (six) hours. 360 tablet 1   tiZANidine (ZANAFLEX) 4 MG tablet ONE CAPSULE BY MOUTH EVERY 8 TO 12 HOURS AS NEEDED FOR MUSCLE SPASMS 60 tablet 2   potassium chloride SA (KLOR-CON) 20 MEQ tablet Take 1 tablet (20 mEq total) by mouth daily. 30 tablet 3   simethicone (GAS-X) 80 MG chewable tablet Chew 1 tablet (80 mg total) by mouth every 6 (six) hours as needed for flatulence. (Patient not taking: Reported on 07/22/2021) 60 tablet 1   No  facility-administered medications prior to visit.    Allergies  Allergen Reactions   Penicillins Hives    Has patient had a PCN reaction causing immediate rash, facial/tongue/throat swelling, SOB or lightheadedness with hypotension: No Has patient had a PCN reaction causing severe rash involving mucus membranes or skin necrosis: No Has patient had a PCN reaction that required hospitalization No Has patient had a PCN reaction occurring within the last 10 years: No If all of the above answers are "NO", then may proceed with Cephalosporin use.       Objective:    BP 119/86    Pulse 82    Ht 5\' 4"  (1.626 m)    Wt 229 lb (103.9 kg)    LMP 06/24/2021    SpO2 98%    BMI 39.31 kg/m  Wt Readings from Last 3 Encounters:  07/22/21 229 lb (103.9 kg)  04/15/21 242 lb 6.4 oz (110 kg)  12/06/20 220 lb (99.8 kg)    Physical Exam Vitals and nursing note reviewed.  Constitutional:      Appearance: She is well-developed.  HENT:     Head: Normocephalic and atraumatic.  Cardiovascular:     Rate and Rhythm: Normal rate and regular rhythm.     Heart sounds: Normal heart sounds. No murmur heard.   No friction rub. No gallop.  Pulmonary:     Effort: Pulmonary effort is normal. No tachypnea or respiratory distress.     Breath sounds: Normal breath sounds. No decreased breath sounds, wheezing, rhonchi or rales.  Chest:     Chest wall: No tenderness.  Abdominal:     General: Bowel sounds are normal.     Palpations: Abdomen is soft.  Musculoskeletal:        General: Normal range of motion.     Cervical back: Normal range of motion.  Skin:    General: Skin is warm and dry.  Neurological:     Mental Status: She is alert and oriented to person, place, and time.     Coordination: Coordination normal.  Psychiatric:        Behavior: Behavior normal. Behavior is cooperative.        Thought Content: Thought content normal.        Judgment: Judgment normal.         Patient has been counseled  extensively about nutrition and exercise as well as the importance of adherence with medications and regular follow-up. The patient was given clear instructions to go to ER or return to medical center if symptoms don't improve, worsen or new problems develop. The patient verbalized understanding.   Follow-up: Return in about 3 months (around 10/19/2021).   Gildardo Pounds, FNP-BC [facility] and [place], [place]   07/22/2021, 1:35 PM

## 2021-07-23 LAB — LIPID PANEL
Chol/HDL Ratio: 2.4 ratio (ref 0.0–4.4)
Cholesterol, Total: 260 mg/dL — ABNORMAL HIGH (ref 100–199)
HDL: 110 mg/dL (ref >39)
LDL Chol Calc (NIH): 135 mg/dL — ABNORMAL HIGH (ref 0–99)
Triglycerides: 93 mg/dL (ref 0–149)
VLDL Cholesterol Cal: 15 mg/dL (ref 5–40)

## 2021-07-24 ENCOUNTER — Telehealth: Payer: Self-pay

## 2021-07-24 ENCOUNTER — Other Ambulatory Visit: Payer: Self-pay | Admitting: Nurse Practitioner

## 2021-07-24 MED ORDER — ATORVASTATIN CALCIUM 20 MG PO TABS: 20.0000 mg | ORAL_TABLET | Freq: Every day | ORAL | 3 refills | Status: AC

## 2021-07-24 NOTE — Telephone Encounter (Signed)
-----   Message from Claiborne Rigg, NP sent at 07/24/2021  8:04 AM EST ----- Cholesterol levels are still elevated.  Continue atorvastatin 20 mg daily as prescribed.

## 2021-07-24 NOTE — Telephone Encounter (Signed)
Called patient reviewed all information and repeated back to me. Will call if any questions.  ? ?

## 2021-09-23 ENCOUNTER — Ambulatory Visit: Payer: Self-pay | Admitting: *Deleted

## 2021-09-23 NOTE — Telephone Encounter (Signed)
Still no answer, message left. ?

## 2021-09-23 NOTE — Telephone Encounter (Signed)
Pt has had three call backs. Message left that other providers in the practice have openings this week. Asked pt to call back to schedule. ?

## 2021-09-23 NOTE — Telephone Encounter (Signed)
Summary: leg swelling on both legs/feels tightness at the bottom of legs  ? Pt stated she has leg swelling on both legs. Pt stated this started about two weeks ago also feels tightness at the bottom of legs. Pt denied pain.  ? ?Please call back at [phone]  ? ?Pt seeking clinical advice.   ?  ?Attempted to call patient at number provided- left message to call office ?

## 2021-09-30 ENCOUNTER — Ambulatory Visit: Payer: Self-pay | Admitting: *Deleted

## 2021-09-30 NOTE — Telephone Encounter (Signed)
?  Chief Complaint: Leg swelling ?Symptoms: Both legs swelling, worsening x 1 week. Feet and ankles with pitting edema, legs with redness,wound on right foot "Toe", another one "Starting on right leg." ?Frequency: Worsening 1 week ago ?Pertinent Negatives: Patient denies  ?Disposition: [] ED /[x] Urgent Care (no appt availability in office) / [] Appointment(In office/virtual)/ []  [facility]/ [] Home Care/ [] Refused Recommended Disposition /[] [facility]/ []  Follow-up with PCP ?Additional Notes: No availability at practice or [facility] today. Advised UC. States will follow disposition. ?Reason for Disposition ? [1] MODERATE leg swelling (e.g., swelling extends up to knees) AND [2] new-onset or worsening ? ?Answer Assessment - Initial Assessment Questions ?1. ONSET: "When did the swelling start?" (e.g., minutes, hours, days) ?    1 week ago ?2. LOCATION: "What part of the leg is swollen?"  "Are both legs swollen or just one leg?" ?    Both feet and ankles ?3. SEVERITY: "How bad is the swelling?" (e.g., localized; mild, moderate, severe) ? - Localized - small area of swelling localized to one leg ? - MILD pedal edema - swelling limited to foot and ankle, pitting edema < 1/4 inch (6 mm) deep, rest and elevation eliminate most or all swelling ? - MODERATE edema - swelling of lower leg to knee, pitting edema > 1/4 inch (6 mm) deep, rest and elevation only partially reduce swelling ? - SEVERE edema - swelling extends above knee, facial or hand swelling present  ?    Severe ?4. REDNESS: "Does the swelling look red or infected?" ?    To legs ?5. PAIN: "Is the swelling painful to touch?" If Yes, ask: "How painful is it?"   (Scale 1-10; mild, moderate or severe) ?    "One time legs hurting a bit." ?6. FEVER: "Do you have a fever?" If Yes, ask: "What is it, how was it measured, and when did it start?"  ?    no ?7. CAUSE: "What do you think is causing the leg swelling?" ?    Unsure ?8. MEDICAL HISTORY: "Do you  have a history of heart failure, kidney disease, liver failure, or cancer?" ?     ?9. RECURRENT SYMPTOM: "Have you had leg swelling before?" If Yes, ask: "When was the last time?" "What happened that time?" ?    Yes, worsening ?10. OTHER SYMPTOMS: "Do you have any other symptoms?" (e.g., chest pain, difficulty breathing) ?      Mild SOB with exertion, sore on right foot, toe, and on leg ? ?Protocols used: Leg Swelling and Edema-A-AH ? ?

## 2021-10-02 ENCOUNTER — Ambulatory Visit: Payer: Self-pay

## 2021-10-02 NOTE — Telephone Encounter (Signed)
Pt called on different number and LVMTCB to discuss symptoms. ? ?

## 2021-10-02 NOTE — Telephone Encounter (Signed)
Patient called, left VM to return the call to the office to discuss symptoms with a nurse. ? ?Summary: Leg swelling  ? Pt called to report that she is experiencing swelling in her legs  ? ?Best contact: [phone]   ?  ? ?

## 2021-10-02 NOTE — Telephone Encounter (Signed)
Pt called, LVMTCB. Unable to reach patient after 3 attempts by [facility] NT, routing to the provider for resolution per protocol. ? ?

## 2021-10-07 ENCOUNTER — Ambulatory Visit: Payer: Medicaid Other | Admitting: Physician Assistant

## 2021-10-07 ENCOUNTER — Encounter: Payer: Self-pay | Admitting: Physician Assistant

## 2021-10-07 VITALS — BP 127/83 | HR 85 | Temp 98.2°F | Resp 18 | Ht 64.0 in | Wt 241.0 lb

## 2021-10-07 DIAGNOSIS — R600 Localized edema: Secondary | ICD-10-CM

## 2021-10-07 DIAGNOSIS — I10 Essential (primary) hypertension: Secondary | ICD-10-CM

## 2021-10-07 DIAGNOSIS — L602 Onychogryphosis: Secondary | ICD-10-CM

## 2021-10-07 DIAGNOSIS — Z6841 Body Mass Index (BMI) 40.0 and over, adult: Secondary | ICD-10-CM

## 2021-10-07 MED ORDER — FUROSEMIDE 20 MG PO TABS: 20.0000 mg | ORAL_TABLET | Freq: Every day | ORAL | 0 refills | Status: AC

## 2021-10-07 NOTE — Progress Notes (Signed)
? ?Established Patient Office Visit ? ?Subjective   ?Patient ID: Jacqueline Juarez, female    DOB: 1980/11/25  Age: 41 y.o. MRN: [id number] ? ?Chief Complaint  ?Patient presents with  ? Leg Swelling  ?   ?Bilateral  ? ? ?States that she has been out of her blood pressure medication for the past month.  States that she has been having bilateral lower leg and feet swelling for the past 3 weeks.  States that she has been checking her blood pressure at home, states that her blood pressure readings have been within normal limits. ? ?Denies shortness of breath, chest pain.  Has not tried anything for relief.   ? ?States that she did have a blister at that tip of her second digit on her right foot.  States it has since resolved. ? ?Mother is present and helps with history. ? ? ? ?Past Medical History:  ?Diagnosis Date  ? Anxiety   ? At age 17  ? Arthritis Dx 2014  ? Chronic back pain   ? High cholesterol Dx 2014  ? Hypertension DX 2014  ? Lumbar radiculopathy Dx 2015  ? Panic   ? At age 11  ? Paresthesias   ? left hand, bilat feet  ? Peripheral neuropathy   ? left hand  ? ?Social History  ? ?Socioeconomic History  ? Marital status: Single  ?  Spouse name: Not on file  ? Number of children: 2  ? Years of education: 9th  ? Highest education level: Not on file  ?Occupational History  ?  Comment: disabled  ?Tobacco Use  ? Smoking status: Never  ? Smokeless tobacco: Never  ?Vaping Use  ? Vaping Use: Never used  ?Substance and Sexual Activity  ? Alcohol use: Not Currently  ? Drug use: No  ? Sexual activity: Not Currently  ?Other Topics Concern  ? Not on file  ?Social History Narrative  ? Patient is single and lives at home with her children.   ? Patient is disabled.  ? Education 9 th grade.  ? Right handed.  ? Caffeine sweet tea two bottles daily.  ? ?Social Determinants of Health  ? ?Financial Resource Strain: Not on file  ?Food Insecurity: Not on file  ?Transportation Needs: Not on file  ?Physical Activity: Not on file   ?Stress: Not on file  ?Social Connections: Not on file  ?Intimate Partner Violence: Not on file  ? ?Family History  ?Problem Relation Age of Onset  ? Arthritis Mother   ? Hypertension Mother   ? Arthritis Father   ? ?Allergies  ?Allergen Reactions  ? Penicillins Hives  ?  Has patient had a PCN reaction causing immediate rash, facial/tongue/throat swelling, SOB or lightheadedness with hypotension: No ?Has patient had a PCN reaction causing severe rash involving mucus membranes or skin necrosis: No ?Has patient had a PCN reaction that required hospitalization No ?Has patient had a PCN reaction occurring within the last 10 years: No ?If all of the above answers are "NO", then may proceed with Cephalosporin use.  ? ?  ? ?Review of Systems  ?Constitutional: Negative.   ?HENT: Negative.    ?Eyes: Negative.   ?Respiratory:  Negative for shortness of breath.   ?Cardiovascular:  Positive for leg swelling. Negative for chest pain and palpitations.  ?Gastrointestinal: Negative.   ?Genitourinary: Negative.   ?Musculoskeletal: Negative.   ?Skin: Negative.   ?Neurological: Negative.   ?Endo/Heme/Allergies: Negative.   ?Psychiatric/Behavioral: Negative.    ? ?  ?  Objective:  ?  ? ?BP 127/83 (BP Location: Left Arm, Patient Position: Sitting, Cuff Size: Normal)   Pulse 85   Temp 98.2 ?F (36.8 ?C) (Oral)   Resp 18   Ht 5\' 4"  (1.626 m)   Wt 241 lb (109.3 kg)   LMP 09/26/2021   SpO2 98%   BMI 41.37 kg/m?  ?BP Readings from Last 3 Encounters:  ?10/07/21 127/83  ?07/22/21 119/86  ?04/15/21 137/90  ? ?  ? ?Physical Exam ?Vitals and nursing note reviewed.  ?Constitutional:   ?   General: She is not in acute distress. ?   Appearance: Normal appearance. She is obese. She is not ill-appearing.  ?HENT:  ?   Head: Normocephalic and atraumatic.  ?   Right Ear: External ear normal.  ?   Left Ear: External ear normal.  ?   Nose: Nose normal.  ?   Mouth/Throat:  ?   Mouth: Mucous membranes are moist.  ?   Pharynx: Oropharynx is clear.   ?Eyes:  ?   Extraocular Movements: Extraocular movements intact.  ?   Conjunctiva/sclera: Conjunctivae normal.  ?   Pupils: Pupils are equal, round, and reactive to light.  ?Cardiovascular:  ?   Rate and Rhythm: Normal rate and regular rhythm.  ?   Pulses: Normal pulses.     ?     Dorsalis pedis pulses are 2+ on the right side and 2+ on the left side.  ?     Posterior tibial pulses are 2+ on the right side and 2+ on the left side.  ?   Heart sounds: Normal heart sounds.  ?Pulmonary:  ?   Effort: Pulmonary effort is normal.  ?   Breath sounds: Normal breath sounds.  ?Musculoskeletal:  ?   Cervical back: Normal range of motion and neck supple.  ?   Right lower leg: 2+ Pitting Edema present.  ?   Left lower leg: 2+ Pitting Edema present.  ?Feet:  ?   Right foot:  ?   Toenail Condition: Right toenails are abnormally thick and long. Fungal disease present. ?   Left foot:  ?   Toenail Condition: Left toenails are abnormally thick and long. Fungal disease present. ?Skin: ?   General: Skin is warm and dry.  ?Neurological:  ?   General: No focal deficit present.  ?   Mental Status: She is alert and oriented to person, place, and time.  ?Psychiatric:     ?   Mood and Affect: Mood normal.     ?   Behavior: Behavior normal.     ?   Thought Content: Thought content normal.     ?   Judgment: Judgment normal.  ? ? ? ?  ?Assessment & Plan:  ? ?Problem List Items Addressed This Visit   ? ?  ? Cardiovascular and Mediastinum  ? HTN (hypertension) (Chronic)  ? Relevant Medications  ? furosemide (LASIX) 20 MG tablet  ? ?Other Visit Diagnoses   ? ? Localized edema    -  Primary  ? Relevant Medications  ? furosemide (LASIX) 20 MG tablet  ? Other Relevant Orders  ? Basic Metabolic Panel  ? Brain natriuretic peptide  ? Class 3 severe obesity due to excess calories with serious comorbidity and body mass index (BMI) of 40.0 to 44.9 in adult [facility])      ? Long toenail      ? ?  ?1. Localized edema ?GFR on April 15, 2021 was within  normal  limits.  Trial Lasix, patient education given on supportive care, red flags given for prompt reevaluation. ? ?Patient is already scheduled for follow-up with primary care provider on May 3, encouraged to keep that appointment. ?- Basic Metabolic Panel ?- Brain natriuretic peptide ?- furosemide (LASIX) 20 MG tablet; Take 1 tablet (20 mg total) by mouth daily for 10 days.  Dispense: 10 tablet; Refill: 0 ? ?2. Primary hypertension ?Patient has been out of her blood pressure medication for the last month and blood pressure readings have been within normal limits.  Will hold refill today. ? ?3. Class 3 severe obesity due to excess calories with serious comorbidity and body mass index (BMI) of 40.0 to 44.9 in adult [facility]) ?Patient education given on low sugar diet ? ?4. Long toenail ?Patient strongly encouraged to keep nails clean and trim. ? ? ?I have reviewed the patient's medical history (PMH, PSH, Social History, Family History, Medications, and allergies) , and have been updated if relevant. I spent 30 minutes reviewing chart and  face to face time with patient. ? ? ? ? ?Return if symptoms worsen or fail to improve, for already has appt with Zelda .  ? ? ?Inaaya Vellucci S Mayers, PA-C ? ?

## 2021-10-07 NOTE — Progress Notes (Signed)
Patient reports being out of medication for the past month. ?Patient reports bilateral leg/ankle/foot swelling with a sore on the bottom of the R foot. ?Patient has eaten today. ? ?

## 2021-10-07 NOTE — Patient Instructions (Addendum)
You are going to take Lasix once a day for the next 10 days.  You can discontinue the medication if your swelling has resolved. ? ?I do encourage you to increase your water intake, make sure that you are eating a low-sodium diet. ? ?I encourage you to continue checking your blood pressure on a daily basis, keep a written log and have those available for your office visit on May 3 with your primary care provider. ? ?I strongly encourage you to work on keeping your toenails trimmed. ? ?We will call you with today's lab results. ? ?Kennieth Rad, PA-C ?Physician Assistant ?[place] ?[email or web address] ? ? ?Edema ? ?Edema is an abnormal buildup of fluids in the body tissues and under the skin. Swelling of the legs, feet, and ankles is a common symptom that becomes more likely as you get older. Swelling is also common in looser tissues, such as around the eyes. Pressing on the area may make a temporary dent in your skin (pitting edema). This fluid may also accumulate in your lungs (pulmonary edema). ?There are many possible causes of edema. Eating too much salt (sodium) and being on your feet or sitting for a long time can cause edema in your legs, feet, and ankles. Common causes of edema include: ?Certain medical conditions, such as heart failure, liver or kidney disease, and cancer. ?Weak leg blood vessels. ?An injury. ?Pregnancy. ?Medicines. ?Being obese. ?Low protein levels in the blood. ?Hot weather may make edema worse. Edema is usually painless. Your skin may look swollen or shiny. ?Follow these instructions at home: ?Medicines ?Take over-the-counter and prescription medicines only as told by your health care provider. ?Your health care provider may prescribe a medicine to help your body get rid of extra water (diuretic). Take this medicine if you are told to take it. ?Eating and drinking ?Eat a low-salt (low-sodium) diet to reduce fluid as told by your  health care provider. Sometimes, eating less salt may reduce swelling. ?Depending on the cause of your swelling, you may need to limit how much fluid you drink (fluid restriction). ?General instructions ?Raise (elevate) the injured area above the level of your heart while you are sitting or lying down. ?Do not sit still or stand for long periods of time. ?Do not wear tight clothing. Do not wear garters on your upper legs. ?Exercise your legs to get your circulation going. This helps to move the fluid back into your blood vessels, and it may help the swelling go down. ?Wear compression stockings as told by your health care provider. These stockings help to prevent blood clots and reduce swelling in your legs. It is important that these are the correct size. These stockings should be prescribed by your health care provider to prevent possible injuries. ?If elastic bandages or wraps are recommended, use them as told by your health care provider. ?Contact a health care provider if: ?Your edema does not get better with treatment. ?You have heart, liver, or kidney disease and have symptoms of edema. ?You have sudden and unexplained weight gain. ?Get help right away if: ?You develop shortness of breath or chest pain. ?You cannot breathe when you lie down. ?You develop pain, redness, or warmth in the swollen areas. ?You have heart, liver, or kidney disease and suddenly get edema. ?You have a fever and your symptoms suddenly get worse. ?These symptoms may be an emergency. Get help right away. Call 911. ?Do not wait to see if the symptoms will go away. ?  Do not drive yourself to the hospital. ?Summary ?Edema is an abnormal buildup of fluids in the body tissues and under the skin. ?Eating too much salt (sodium)and being on your feet or sitting for a long time can cause edema in your legs, feet, and ankles. ?Raise (elevate) the injured area above the level of your heart while you are sitting or lying down. ?Follow your health  care provider's instructions about diet and how much fluid you can drink. ?This information is not intended to replace advice given to you by your health care provider. Make sure you discuss any questions you have with your health care provider. ?Document Revised: 02/09/2021 Document Reviewed: 02/09/2021 ?Elsevier Patient Education ? [place]. ? ?

## 2021-10-08 LAB — BASIC METABOLIC PANEL
BUN/Creatinine Ratio: 11 (ref 9–23)
BUN: 8 mg/dL (ref 6–24)
CO2: 21 mmol/L (ref 20–29)
Calcium: 9.1 mg/dL (ref 8.7–10.2)
Chloride: 107 mmol/L — ABNORMAL HIGH (ref 96–106)
Creatinine, Ser: 0.7 mg/dL (ref 0.57–1.00)
Glucose: 74 mg/dL (ref 70–99)
Potassium: 3.9 mmol/L (ref 3.5–5.2)
Sodium: 141 mmol/L (ref 134–144)
eGFR: 110 mL/min/{1.73_m2} (ref >59)

## 2021-10-08 LAB — BRAIN NATRIURETIC PEPTIDE: BNP: 25.9 pg/mL (ref 0.0–100.0)

## 2021-10-09 ENCOUNTER — Telehealth: Payer: Self-pay | Admitting: *Deleted

## 2021-10-09 NOTE — Telephone Encounter (Signed)
MA LVM for patient to return a phone call for results. MA attempted x2. ?

## 2021-10-09 NOTE — Telephone Encounter (Signed)
-----   Message from Roney Jaffe, [place] sent at 10/08/2021  2:49 PM EDT ----- ?Please call patient and let her know that her kidney function is WNL, her marker for heart failure was WNL ?

## 2021-10-09 NOTE — Telephone Encounter (Signed)
Patient verified DOB ?Patient is aware of labs being normal and to follow current medications, elevating legs and keep follow-up with provider [place] ?

## 2021-10-21 ENCOUNTER — Ambulatory Visit: Payer: Medicaid Other | Admitting: Nurse Practitioner

## 2021-10-22 DIAGNOSIS — F419 Anxiety disorder, unspecified: Secondary | ICD-10-CM | POA: Insufficient documentation

## 2021-10-22 DIAGNOSIS — F3341 Major depressive disorder, recurrent, in partial remission: Secondary | ICD-10-CM | POA: Insufficient documentation

## 2021-11-11 ENCOUNTER — Institutional Professional Consult (permissible substitution): Payer: Medicaid Other | Admitting: Adult Health

## 2021-11-13 ENCOUNTER — Institutional Professional Consult (permissible substitution): Payer: Medicaid Other | Admitting: Adult Health

## 2021-12-14 DIAGNOSIS — R609 Edema, unspecified: Secondary | ICD-10-CM | POA: Insufficient documentation

## 2021-12-14 DIAGNOSIS — R9431 Abnormal electrocardiogram [ECG] [EKG]: Secondary | ICD-10-CM | POA: Insufficient documentation

## 2021-12-15 ENCOUNTER — Ambulatory Visit (INDEPENDENT_AMBULATORY_CARE_PROVIDER_SITE_OTHER): Payer: Medicaid Other | Admitting: Primary Care

## 2021-12-15 ENCOUNTER — Encounter: Payer: Self-pay | Admitting: Primary Care

## 2021-12-15 VITALS — BP 118/86 | HR 71 | Ht 66.0 in | Wt 246.0 lb

## 2021-12-15 DIAGNOSIS — Z9189 Other specified personal risk factors, not elsewhere classified: Secondary | ICD-10-CM | POA: Diagnosis not present

## 2021-12-15 DIAGNOSIS — G4719 Other hypersomnia: Secondary | ICD-10-CM | POA: Diagnosis not present

## 2021-12-15 NOTE — Assessment & Plan Note (Signed)
-   Patient has symptoms of restless sleep and daytime sleepiness.  Denies snoring or witnessed apnea.  Epworth 5/24.  BMI 39.  Concern patient could have underlying sleep apnea, she would unlikely be able to follow instructions to complete home sleep study.  Recommend in lab sleep study to evaluate for OSA.  Reviewed risks of untreated sleep apnea including cardiac arrhythmias, pulmonary hypertension, stroke, diabetes.  We briefly reviewed treatment options including weight loss, oral appliance, CPAP therapy or referral to ENT for possible surgical options.  Encourage patient focus on side sleeping position, urged weight loss efforts and advised against driving if experiencing excessive daytime sleepiness or fatigue.  Follow-up in 6 weeks to review sleep study results in office and treatment options if needed.

## 2021-12-16 NOTE — Progress Notes (Signed)
Reviewed and agree with assessment/plan.   Coralyn Helling, MD [facility] Pulmonary/Critical Care 12/16/2021, 7:32 AM Pager:  [phone]

## 2021-12-27 NOTE — Progress Notes (Deleted)
   Established Patient Office Visit  Subjective   Patient ID: Jacqueline Juarez, female    DOB: [birth year]  Age: 41 y.o. MRN: [id number]  No chief complaint on file.   41 y.o.F PCP pt fleming  Last seen 09/2021 by Tomi Bamberger PA  Been to a [facility] PCP 6/26 Fern Diane Belgard is a 41 y.o. female with past medical history as above presented for evaluation for edema, abnormal ECG.  Bilateral lower extremity edema-chronic venous insufficiency is high on differential. CHF is less likely considering negative chest x-ray and normal BNP but hypertensive heart disease with heart failure with preserved ejection fraction cannot be completely excluded. DVT is also on the differential. Patient is on chlorthalidone 25 mg p.o. daily for years as per patient. Since last month May, 2023 patient takes furosemide 40 mg p.o. twice daily and KCl 10 mg and p.o. daily as per patient.  Abnormal ECG-hypertensive heart disease is high on differential. Patient denies chest pain, dyspnea, palpitations, near-syncope or syncope. Patient has risk factors for CAD-HTN, HLP, class III obesity. Patient has chronic back pain and chronic unsteady gait which limit her mobility but she still can walk about 2 blocks.  Hypertension-blood pressure is mildly elevated today. Per patient her machine is not working and she does not check BP at home but usually BP is normal as per patient.  Currently patient takes chlorthalidone 25 mg p.o. daily and furosemide 40 mg p.o. daily and KCl 10 mEq. Recent K was noted 4.1, creatinine 0.81, GFR 92 from last month May 2023. I will repeat BMP in few days to reevaluate K and mag and creatinine.  Hyperlipidemia-patient is on atorvastatin 20 mg per PCPs suggestion.  The goal of LDL cholesterol less than 409, HDL cholesterol more than 50, Triglycerides less than 150.  Recent lipid panel from last month May 2023 was at goal with LDL 84, HDL 85 and TG 68 as above.  Further management of HLP  as per PCP.  Class III obesity-patient gained about 12 pounds over the last year and weight is now 244 pounds with BMI 43.2.  I strongly encouraged patient to stay on low salt, low cholesterol diet, walk daily and lose weight.  Chronic back pain and unsteady gait for years -noted. Further management as per PCP.  PLAN:  Cardiac echo.  Lower extremity Venous doppler.  Blood tests in a week.  Keep checking blood pressure and heart rate daily and record it and let us or primary care physician know if any abnormal readings noted.  Stay on low salt, low cholesterol diet, walk daily and lose weight.  Drink plenty of water - about 60-64 ounces or more a day - to avoid dehydration and volume depletion.  Please avoid caffeine.  Avoid any strenuous activities.  Please avoid alcohol.      {History (Optional):23778}  ROS    Objective:     There were no vitals taken for this visit. {Vitals History (Optional):23777}  Physical Exam   No results found for any visits on 12/28/21.  {Labs (Optional):23779}  The ASCVD Risk score (Arnett DK, et al., 2019) failed to calculate for the following reasons:   The valid HDL cholesterol range is 20 to 100 mg/dL    Assessment & Plan:   Problem List Items Addressed This Visit   None   No follow-ups on file.    Shan Levans, MD

## 2021-12-28 ENCOUNTER — Ambulatory Visit: Payer: Medicaid Other | Admitting: Critical Care Medicine

## 2022-02-02 ENCOUNTER — Ambulatory Visit: Payer: Medicaid Other | Admitting: Primary Care

## 2022-02-02 NOTE — Progress Notes (Deleted)
@Patient  ID: , female    DOB: [birth year], 41 y.o.   MRN: [id number]  No chief complaint on file.   Referring provider: [id number], NP  HPI: 41 year old female, never smoked.  Past medical history significant for hypertension, lumbar radiculopathy, anxiety.   Previous [facility] pulmonary encounter: 12/15/2021 Patient presents today for sleep consult.  Referred by primary care due to concerns for sleep apnea.  Patient has symptoms of excessive daytime sleepiness.  She is minimally symptomatic.  She tells me that she is a very light sleeper.  No snoring or witnessed apnea.  Typical bedtime 9 PM.  It does not take her long to fall asleep.  She wakes up 1-2 times throughout the night to use the restroom or help with her daughters baby.  She starts her day at 7 AM.  She takes 1 nap daily.  No prior sleep study.  She does not wear CPAP or oxygen.  Epworth score is 5.  Denies narcolepsy, cataplexy or sleepwalking.  Sleep questionnaire Symptoms- excessive daytime sleepiness, restless sleep Prior sleep study- None Bedtime- 9pm Time to fall asleep- Not long  Nocturnal awakenings- 1-2 times Out of bed/start of day- 6am Weight changes- up Do you operate heavy machinery- No Do you currently wear CPAP- No Do you current wear oxygen- No Epworth- 5  02/02/2022- Interim hx  Patient presents today to review sleep study results.   She has not had sleep study    Allergies  Allergen Reactions   Penicillins Hives    Has patient had a PCN reaction causing immediate rash, facial/tongue/throat swelling, SOB or lightheadedness with hypotension: No Has patient had a PCN reaction causing severe rash involving mucus membranes or skin necrosis: No Has patient had a PCN reaction that required hospitalization No Has patient had a PCN reaction occurring within the last 10 years: No If all of the above answers are "NO", then may proceed with Cephalosporin use.    Immunization History   Administered Date(s) Administered   Influenza, High Dose Seasonal PF 03/28/2015, 07/26/2016, 03/23/2017   Influenza,inj,Quad PF,6+ Mos 03/28/2015, 07/26/2016, 03/23/2017, 08/13/2020, 07/22/2021   PFIZER(Purple Top)SARS-COV-2 Vaccination 11/07/2019, 11/28/2019   Tdap 01/29/2015    Past Medical History:  Diagnosis Date   Anxiety    At age 57   Arthritis Dx 2014   Chronic back pain    High cholesterol Dx 2014   Hypertension DX 2014   Lumbar radiculopathy Dx 2015   Panic    At age 73   Paresthesias    left hand, bilat feet   Peripheral neuropathy    left hand    Tobacco History: Social History   Tobacco Use  Smoking Status Never  Smokeless Tobacco Never   Counseling given: Not Answered   Outpatient Medications Prior to Visit  Medication Sig Dispense Refill   atorvastatin (LIPITOR) 20 MG tablet Take 1 tablet (20 mg total) by mouth daily. 90 tablet 3   chlorthalidone (HYGROTON) 25 MG tablet Take 0.5 tablets (12.5 mg total) by mouth daily. 45 tablet 0   clotrimazole-betamethasone (LOTRISONE) cream Apply 1 application topically 2 (two) times daily. X 20month (Patient not taking: Reported on 12/15/2021) 30 g 1   DULoxetine (CYMBALTA) 30 MG capsule Take 1 capsule (30 mg total) by mouth daily. (Patient not taking: Reported on 12/15/2021) 90 capsule 3   ferrous sulfate 325 (65 FE) MG tablet Take 1 tablet by mouth.     furosemide (LASIX) 20 MG tablet Take 1 tablet (20  mg total) by mouth daily for 10 days. 10 tablet 0   gabapentin (NEURONTIN) 300 MG capsule Take 1 capsule every morning by mouth. Take 2 capsules by mouth at night 90 capsule 3   hydrOXYzine (ATARAX) 25 MG tablet Take by mouth.     hydrOXYzine (ATARAX/VISTARIL) 25 MG tablet Take 1 tablet (25 mg total) by mouth every 6 (six) hours. 360 tablet 1   Magnesium 500 MG CAPS Take 1 capsule by mouth.     nitrofurantoin, macrocrystal-monohydrate, (MACROBID) 100 MG capsule Take by mouth.     potassium chloride (KLOR-CON) 10 MEQ  tablet Take 10 mEq by mouth daily.     potassium chloride SA (KLOR-CON M) 20 MEQ tablet Take 1 tablet (20 mEq total) by mouth daily. 30 tablet 3   tiZANidine (ZANAFLEX) 4 MG tablet ONE CAPSULE BY MOUTH EVERY 8 TO 12 HOURS AS NEEDED FOR MUSCLE SPASMS (Patient not taking: Reported on 12/15/2021) 60 tablet 2   No facility-administered medications prior to visit.      Review of Systems  Review of Systems   Physical Exam  There were no vitals taken for this visit. Physical Exam   Lab Results:  CBC    Component Value Date/Time   WBC 9.0 12/06/2020 2026   RBC 4.40 12/06/2020 2026   HGB 14.0 12/06/2020 2026   HGB 12.9 08/13/2020 1451   HCT 40.5 12/06/2020 2026   HCT 37.7 08/13/2020 1451   PLT 370 12/06/2020 2026   PLT 287 08/13/2020 1451   MCV 92.0 12/06/2020 2026   MCV 91 08/13/2020 1451   MCH 31.8 12/06/2020 2026   MCHC 34.6 12/06/2020 2026   RDW 13.3 12/06/2020 2026   RDW 13.1 08/13/2020 1451   LYMPHSABS 2.4 12/06/2020 2026   LYMPHSABS 2.5 08/29/2018 1044   MONOABS 0.8 12/06/2020 2026   EOSABS 0.1 12/06/2020 2026   EOSABS 0.1 08/29/2018 1044   BASOSABS 0.1 12/06/2020 2026   BASOSABS 0.1 08/29/2018 1044    BMET    Component Value Date/Time   NA 141 10/07/2021 1026   K 3.9 10/07/2021 1026   CL 107 (H) 10/07/2021 1026   CO2 21 10/07/2021 1026   GLUCOSE 74 10/07/2021 1026   GLUCOSE 99 12/06/2020 2026   BUN 8 10/07/2021 1026   CREATININE 0.70 10/07/2021 1026   CREATININE 0.62 08/16/2016 1054   CALCIUM 9.1 10/07/2021 1026   GFRNONAA >60 12/06/2020 2026   GFRNONAA >89 08/16/2016 1054   GFRAA 116 08/13/2020 1451   GFRAA >89 08/16/2016 1054    BNP    Component Value Date/Time   BNP 25.9 10/07/2021 1026    ProBNP No results found for: "PROBNP"  Imaging: No results found.   Assessment & Plan:   No problem-specific Assessment & Plan notes found for this encounter.     Glenford Bayley, NP 02/02/2022

## 2022-02-21 ENCOUNTER — Ambulatory Visit (HOSPITAL_BASED_OUTPATIENT_CLINIC_OR_DEPARTMENT_OTHER): Payer: Medicaid Other | Attending: Primary Care | Admitting: Pulmonary Disease

## 2023-11-17 ENCOUNTER — Emergency Department (HOSPITAL_COMMUNITY)
Admission: EM | Admit: 2023-11-17 | Discharge: 2023-11-17 | Disposition: A | Discharge status: Home / Self Care | Attending: Emergency Medicine | Admitting: Emergency Medicine

## 2023-11-17 ENCOUNTER — Encounter (HOSPITAL_COMMUNITY): Payer: Self-pay | Admitting: Emergency Medicine

## 2023-11-17 DIAGNOSIS — R600 Localized edema: Secondary | ICD-10-CM | POA: Insufficient documentation

## 2023-11-17 DIAGNOSIS — M79604 Pain in right leg: Secondary | ICD-10-CM | POA: Diagnosis present

## 2023-11-17 DIAGNOSIS — E876 Hypokalemia: Secondary | ICD-10-CM | POA: Diagnosis not present

## 2023-11-17 LAB — CBC WITH DIFFERENTIAL/PLATELET
Abs Immature Granulocytes: 0.01 10*3/uL (ref 0.00–0.07)
Basophils Absolute: 0 10*3/uL (ref 0.0–0.1)
Basophils Relative: 1 %
Eosinophils Absolute: 0.1 10*3/uL (ref 0.0–0.5)
Eosinophils Relative: 1 %
HCT: 40.5 % (ref 36.0–46.0)
Hemoglobin: 14 g/dL (ref 12.0–15.0)
Immature Granulocytes: 0 %
Lymphocytes Relative: 37 %
Lymphs Abs: 2.2 10*3/uL (ref 0.7–4.0)
MCH: 32.3 pg (ref 26.0–34.0)
MCHC: 34.6 g/dL (ref 30.0–36.0)
MCV: 93.5 fL (ref 80.0–100.0)
Monocytes Absolute: 0.6 10*3/uL (ref 0.1–1.0)
Monocytes Relative: 11 %
Neutro Abs: 3 10*3/uL (ref 1.7–7.7)
Neutrophils Relative %: 50 %
Platelets: 312 10*3/uL (ref 150–400)
RBC: 4.33 MIL/uL (ref 3.87–5.11)
RDW: 13 % (ref 11.5–15.5)
WBC: 6 10*3/uL (ref 4.0–10.5)
nRBC: 0 % (ref 0.0–0.2)

## 2023-11-17 LAB — COMPREHENSIVE METABOLIC PANEL WITH GFR
ALT: 13 U/L (ref 0–44)
AST: 13 U/L — ABNORMAL LOW (ref 15–41)
Albumin: 3.5 g/dL (ref 3.5–5.0)
Alkaline Phosphatase: 72 U/L (ref 38–126)
Anion gap: 8 (ref 5–15)
BUN: 10 mg/dL (ref 6–20)
CO2: 26 mmol/L (ref 22–32)
Calcium: 9.1 mg/dL (ref 8.9–10.3)
Chloride: 104 mmol/L (ref 98–111)
Creatinine, Ser: 0.64 mg/dL (ref 0.44–1.00)
GFR, Estimated: >60 mL/min (ref >60)
Glucose, Bld: 103 mg/dL — ABNORMAL HIGH (ref 70–99)
Potassium: 3.2 mmol/L — ABNORMAL LOW (ref 3.5–5.1)
Sodium: 138 mmol/L (ref 135–145)
Total Bilirubin: 2.1 mg/dL — ABNORMAL HIGH (ref 0.0–1.2)
Total Protein: 6.8 g/dL (ref 6.5–8.1)

## 2023-11-17 LAB — BRAIN NATRIURETIC PEPTIDE: B Natriuretic Peptide: 32.5 pg/mL (ref 0.0–100.0)

## 2023-11-17 NOTE — Discharge Instructions (Signed)
 Your laboratory results are within normal limits today.  Please follow-up with your primary care physician for your ongoing leg swelling.

## 2023-11-17 NOTE — ED Provider Notes (Signed)
 [facility] Provider Note   CSN: [id number] Arrival date & Juarez: 11/17/23  4098     History HTN, peripheral neuropathy Chief Complaint  Patient presents with   Leg Swelling    Jacqueline Juarez Juarez a 43 y.o. female.  43 y.o female with a past medical history of bilateral leg edema presents to the ED with a chief complaint of bilateral leg pain, reports that Jacqueline Juarez saw her PCP recently who started her on furosemide  40 mg Jacqueline Juarez has been taking daily without much improvement.  Jacqueline Juarez also endorses pain to bilateral knees Jacqueline bilateral ankles, reports this Juarez worsened with any type of activity.  Jacqueline Juarez has not taken any medication for pain control today.  Nuys any trauma, shortness of breath, chest pain.  The history Juarez provided by the patient.       Home Medications Prior to Admission medications   Medication Sig Start Date End Date Taking? Authorizing Provider  atorvastatin  (LIPITOR) 20 MG tablet Take 1 tablet (20 mg total) by mouth daily. 07/24/21   Fleming, Zelda W, NP  chlorthalidone  (HYGROTON ) 25 MG tablet Take 0.5 tablets (12.5 mg total) by mouth daily. 04/15/21   McClung, Angela M, PA-C  clotrimazole -betamethasone  (LOTRISONE ) cream Apply 1 application topically 2 (two) times daily. X 48month Patient not taking: Reported on 12/15/2021 04/15/21   Hassie Lint, PA-C  DULoxetine  (CYMBALTA ) 30 MG capsule Take 1 capsule (30 mg total) by mouth daily. Patient not taking: Reported on 12/15/2021 04/15/21   Hassie Lint, PA-C  ferrous sulfate 325 (65 FE) MG tablet Take 1 tablet by mouth.    [provider]  furosemide  (LASIX ) 20 MG tablet Take 1 tablet (20 mg total) by mouth daily for 10 days. 10/07/21 10/17/21  Mayers, Cari S, PA-C  gabapentin  (NEURONTIN ) 300 MG capsule Take 1 capsule every morning by mouth. Take 2 capsules by mouth at night 04/15/21   McClung, Angela M, PA-C  hydrOXYzine  (ATARAX ) 25 MG tablet Take by mouth. 12/12/19    [provider]  hydrOXYzine  (ATARAX /VISTARIL ) 25 MG tablet Take 1 tablet (25 mg total) by mouth every 6 (six) hours. 04/15/21   Hassie Lint, PA-C  Magnesium  500 MG CAPS Take 1 capsule by mouth.    [provider]  nitrofurantoin , macrocrystal-monohydrate, (MACROBID ) 100 MG capsule Take by mouth.    [provider]  potassium chloride  (KLOR-CON ) 10 MEQ tablet Take 10 mEq by mouth daily. 10/22/21   [provider]  potassium chloride  SA (KLOR-CON  M) 20 MEQ tablet Take 1 tablet (20 mEq total) by mouth daily. 07/22/21   Fleming, Zelda W, NP  tiZANidine  (ZANAFLEX ) 4 MG tablet ONE CAPSULE BY MOUTH EVERY 8 TO 12 HOURS AS NEEDED FOR MUSCLE SPASMS Patient not taking: Reported on 12/15/2021 05/07/21   Newlin, Enobong, MD      Allergies    Penicillins    Review of Systems   Review of Systems  Constitutional:  Negative for fever.  HENT:  Negative for sore throat.   Respiratory:  Negative for shortness of breath.   Cardiovascular:  Positive for leg swelling (bilaterally). Negative for chest pain.  Gastrointestinal:  Negative for abdominal pain, nausea Jacqueline vomiting.  Genitourinary:  Negative for flank pain.  Musculoskeletal:  Negative for back pain.  All other systems reviewed Jacqueline are negative.   Physical Exam Updated Vital Signs BP (!) 153/96   Pulse 64   Temp 98.4 F (36.9 C)   Resp 18  Ht 5\' 3"  (1.6 m)   Wt 104.3 kg   SpO2 99%   BMI 40.74 kg/m  Physical Exam Vitals Jacqueline nursing note reviewed.  HENT:     Head: Normocephalic Jacqueline atraumatic.     Mouth/Throat:     Mouth: Mucous membranes are moist.  Cardiovascular:     Rate Jacqueline Rhythm: Normal rate.     Comments: < 1+ pitting edema. No calf tenderness bilaterally.  Pulmonary:     Effort: Pulmonary effort Juarez normal.     Breath sounds: No wheezing or rales.  Abdominal:     General: Abdomen Juarez flat.  Musculoskeletal:     Cervical back: Normal range of motion Jacqueline neck supple.  Skin:    General:  Skin Juarez warm Jacqueline dry.  Neurological:     Mental Status: Jacqueline Juarez, Jacqueline Juarez, Jacqueline Juarez.     ED Results / Procedures / Treatments   Labs (all labs ordered are listed, but only abnormal results are displayed) Labs Reviewed  COMPREHENSIVE METABOLIC PANEL WITH GFR - Abnormal; Notable for the following components:      Result Value   Potassium 3.2 (*)    Glucose, Bld 103 (*)    AST 13 (*)    Total Bilirubin 2.1 (*)    All other components within normal limits  CBC WITH DIFFERENTIAL/PLATELET  BRAIN NATRIURETIC PEPTIDE    EKG None  Radiology No results found.  Procedures Procedures    Medications Ordered in ED Medications - No data to display  ED Course/ Medical Decision Making/ A&P Clinical Course as of 11/17/23 1214  Thu Nov 17, 2023  1211 Potassium(!): 3.2 [JS]    Clinical Course User Index [JS] Azarya Oconnell, PA-C                                 Medical Decision Making Amount Jacqueline/or Complexity of Data Reviewed Labs: ordered.    This patient presents to the ED for concern of bilateral leg swelling, this involves a number of treatment options, Jacqueline Juarez a complaint that carries with it a high risk of complications Jacqueline morbidity.  The differential diagnosis includes CHF, peripheral neuropathy, versus MSK.    Co morbidities: Discussed in HPI   Brief History:  See HPI.   EMR reviewed including pt PMHx, past surgical history Jacqueline past visits to ER.   See HPI for more details   Lab Tests:  I ordered Jacqueline independently interpreted labs.  The pertinent results include:    I personally reviewed all laboratory work Jacqueline imaging. Metabolic panel without any acute abnormality specifically kidney function within normal limits Jacqueline no significant electrolyte abnormalities. CBC without leukocytosis or significant anemia.   Social Determinants of Health:  The patient's social determinants of health were a factor in the care of this  patient   Problem List / ED Course:  Presented to the ED with ongoing bilateral leg swelling that has been happening for the past 4 weeks.  Seen by PCP earlier this month, given a prescription for Lasix , Jacqueline Juarez on 40 mg of furosemide  that Jacqueline Juarez has been taking daily.  Also takes blood pressure medication along with cholesterol medication.  Jacqueline Juarez denies any shortness of breath, chest pain, has no respiratory symptoms.  On evaluation there Juarez some lower extremity bilateral edema that Juarez less than 1.  No calf tenderness, no prior history of blood clots.  No  concern for DVT on today's visit as swelling seems to be symmetric, no erythema, no streaking the skin to suggest any superimposed infection.  BNP was within normal limits, do not have any concerns for component. CBC Juarez unremarkable, CMP remarkable for slight decrease in potassium, suspect likely due to her diuretic intake.  We discussed appropriate follow-up of her prior diagnoses of peripheral neuropathy.  Jacqueline Juarez will attempt to purchase of compression socks, elevate, decrease sodium intake.  Patient Juarez agreement with plan Jacqueline treatment, return precautions discussed at length.  Patient hemodynamically stable for discharge.   Dispostion:  After consideration of the diagnostic results Jacqueline the patients response to treatment, I feel that the patent would benefit from follow-up with primary care physician.    Portions of this note were generated with Scientist, clinical (histocompatibility Jacqueline immunogenetics). Dictation errors may occur despite best attempts at proofreading.   Final Clinical Impression(s) / ED Diagnoses Final diagnoses:  Bilateral lower extremity edema  Peripheral edema    Rx / DC Orders ED Discharge Orders     None         Luellen Sages, PA-C 11/17/23 1214    Arvilla Birmingham, MD 11/17/23 1451

## 2023-11-17 NOTE — ED Notes (Signed)
 Patient ambulated to bathroom without assistance, urine sample provided.

## 2023-11-17 NOTE — ED Triage Notes (Signed)
 Pt comes in pov for bilateral leg and feet. Complains of ankle bilateral pain. She has been seen by pcp and started on medication.  No medicine for pain this morning.

## 2024-07-30 ENCOUNTER — Ambulatory Visit: Admitting: Primary Care
